# Patient Record
Sex: Male | Born: 1960 | Race: Black or African American | Hispanic: No | Marital: Married
Health system: Midwestern US, Community
[De-identification: ages and names within clinical notes are randomized; demographics above are authoritative.]

## PROBLEM LIST (undated history)

## (undated) DIAGNOSIS — I10 Essential (primary) hypertension: Secondary | ICD-10-CM

## (undated) DIAGNOSIS — Z1211 Encounter for screening for malignant neoplasm of colon: Secondary | ICD-10-CM

## (undated) DIAGNOSIS — C61 Malignant neoplasm of prostate: Secondary | ICD-10-CM

---

## 2014-05-01 NOTE — Patient Instructions (Signed)
DASH Diet: After Your Visit  Your Care Instructions  The DASH diet is an eating plan that can help lower your blood pressure. DASH stands for Dietary Approaches to Stop Hypertension. Hypertension is high blood pressure.  The DASH diet focuses on eating foods that are high in calcium, potassium, and magnesium. These nutrients can lower blood pressure. The foods that are highest in these nutrients are fruits, vegetables, low-fat dairy products, nuts, seeds, and legumes. But taking calcium, potassium, and magnesium supplements instead of eating foods that are high in those nutrients does not have the same effect. The DASH diet also includes whole grains, fish, and poultry.  The DASH diet is one of several lifestyle changes your doctor may recommend to lower your high blood pressure. Your doctor may also want you to decrease the amount of sodium in your diet. Lowering sodium while following the DASH diet can lower blood pressure even further than just the DASH diet alone.  Follow-up care is a key part of your treatment and safety. Be sure to make and go to all appointments, and call your doctor if you are having problems. It's also a good idea to know your test results and keep a list of the medicines you take.  How can you care for yourself at home?  Following the DASH diet  ?? Eat 4 to 5 servings of fruit each day. A serving is 1 medium-sized piece of fruit, ?? cup chopped or canned fruit, 1/4 cup dried fruit, or 4 ounces (?? cup) of fruit juice. Choose fruit more often than fruit juice.  ?? Eat 4 to 5 servings of vegetables each day. A serving is 1 cup of lettuce or raw leafy vegetables, ?? cup of chopped or cooked vegetables, or 4 ounces (?? cup) of vegetable juice. Choose vegetables more often than vegetable juice.  ?? Get 2 to 3 servings of low-fat and fat-free dairy each day. A serving is 8 ounces of milk, 1 cup of yogurt, or 1 ?? ounces of cheese.  ?? Eat 6 to 8 servings of grains each day. A serving is 1 slice of  bread, 1 ounce of dry cereal, or ?? cup of cooked rice, pasta, or cooked cereal. Try to choose whole-grain products as much as possible.  ?? Limit lean meat, poultry, and fish to 2 servings each day. A serving is 3 ounces, about the size of a deck of cards.  ?? Eat 4 to 5 servings of nuts, seeds, and legumes (cooked dried beans, lentils, and split peas) each week. A serving is 1/3 cup of nuts, 2 tablespoons of seeds, or ?? cup cooked dried beans or peas.  ?? Limit fats and oils to 2 to 3 servings each day. A serving is 1 teaspoon of vegetable oil or 2 tablespoons of salad dressing.  ?? Limit sweets and added sugars to 5 servings or less a week. A serving is 1 tablespoon jelly or jam, ?? cup sorbet, or 1 cup of lemonade.  ?? Eat less than 2,300 milligrams (mg) of sodium a day. If you have high blood pressure, diabetes, or chronic kidney disease, if you are African-American, or if you are older than age 50, try to limit the amount of sodium you eat to less than 1,500 mg a day.  Tips for success  ?? Start small. Do not try to make dramatic changes to your diet all at once. You might feel that you are missing out on your favorite foods and then be more   likely to not follow the plan. Make small changes, and stick with them. Once those changes become habit, add a few more changes.  ?? Try some of the following:  ?? Make it a goal to eat a fruit or vegetable at every meal and at snacks. This will make it easy to get the recommended amount of fruits and vegetables each day.  ?? Try yogurt topped with fruit and nuts for a snack or healthy dessert.  ?? Add lettuce, tomato, cucumber, and onion to sandwiches.  ?? Combine a ready-made pizza crust with low-fat mozzarella cheese and lots of vegetable toppings. Try using tomatoes, squash, spinach, broccoli, carrots, cauliflower, and onions.  ?? Have a variety of cut-up vegetables with a low-fat dip as an appetizer instead of chips and dip.  ?? Sprinkle sunflower seeds or chopped almonds over  salads. Or try adding chopped walnuts or almonds to cooked vegetables.  ?? Try some vegetarian meals using beans and peas. Add garbanzo or kidney beans to salads. Make burritos and tacos with mashed pinto beans or black beans.   Where can you learn more?   Go to http://www.healthwise.net/[facility]  Enter H967 in the search box to learn more about "DASH Diet: After Your Visit."   ?? 2006-2015 Healthwise, Incorporated. Care instructions adapted under license by [employer] (which disclaims liability or warranty for this information). This care instruction is for use with your licensed healthcare professional. If you have questions about a medical condition or this instruction, always ask your healthcare professional. Healthwise, Incorporated disclaims any warranty or liability for your use of this information.  Content Version: 10.4.390249; Current as of: February 21, 2013              Low Sodium Diet (2,000 Milligram): After Your Visit  Your Care Instructions  Too much sodium causes your body to hold on to extra water. This can raise your blood pressure and force your heart and kidneys to work harder. In very serious cases, this could cause you to be put in the hospital. It might even be life-threatening. By limiting sodium, you will feel better and lower your risk of serious problems.  The most common source of sodium is salt. People get most of the salt in their diet from canned, prepared, and packaged foods. Fast food and restaurant meals also are very high in sodium. Your doctor will probably limit your sodium to less than 2,000 milligrams (mg) a day. This limit counts all the sodium in prepared and packaged foods and any salt you add to your food.  And try to further reduce how much sodium you eat to less than 1,500 mg a day if you are 51 or older, are black, or have high blood pressure, diabetes, or chronic kidney disease.  Follow-up care is a key part of your treatment and safety. Be sure to make and go to all  appointments, and call your doctor if you are having problems. It's also a good idea to know your test results and keep a list of the medicines you take.  How can you care for yourself at home?  Read food labels  ?? Read labels on cans and food packages. The labels tell you how much sodium is in each serving. Make sure that you look at the serving size. If you eat more than the serving size, you have eaten more sodium.  ?? Food labels also tell you the Percent Daily Value for sodium. Choose products with low Percent Daily Values   for sodium.  ?? Be aware that sodium can come in forms other than salt, including monosodium glutamate (MSG), sodium citrate, and sodium bicarbonate (baking soda). MSG is often added to Asian food. When you eat out, you can sometimes ask for food without MSG or added salt.  Buy low-sodium foods  ?? Buy foods that are labeled "unsalted" (no salt added), "sodium-free" (less than 5 mg of sodium per serving), or "low-sodium" (less than 140 mg of sodium per serving). Foods labeled "reduced-sodium" and "light sodium" may still have too much sodium. Be sure to read the label to see how much sodium you are getting.  ?? Buy fresh vegetables, or frozen vegetables without added sauces. Buy low-sodium versions of canned vegetables, soups, and other canned goods.  Prepare low-sodium meals  ?? Cut back on the amount of salt you use in cooking. This will help you adjust to the taste. Do not add salt after cooking. One teaspoon of salt has about 2,300 mg of sodium.  ?? Take the salt shaker off the table.  ?? Flavor your food with garlic, lemon juice, onion, vinegar, herbs, and spices. Do not use soy sauce, lite soy sauce, steak sauce, onion salt, garlic salt, celery salt, mustard, or ketchup on your food.  ?? Use low-sodium salad dressings, sauces, and ketchup. Or make your own salad dressings and sauces without adding salt.  ?? Use less salt (or none) when recipes call for it. You can often use half the salt a  recipe calls for without losing flavor. Other foods such as rice, pasta, and grains do not need added salt.  ?? Rinse canned vegetables, and cook them in fresh water. This removes some???but not all???of the salt.  ?? Avoid water that is naturally high in sodium or that has been treated with water softeners, which add sodium. Call your local water company to find out the sodium content of your water supply. If you buy bottled water, read the label and choose a sodium-free brand.  Avoid high-sodium foods  ?? Avoid eating:  ?? Smoked, cured, salted, and canned meat, fish, and poultry.  ?? Ham, bacon, hot dogs, and luncheon meats.  ?? Regular, hard, and processed cheese and regular peanut butter.  ?? Crackers with salted tops, and other salted snack foods such as pretzels, chips, and salted popcorn.  ?? Frozen prepared meals, unless labeled low-sodium.  ?? Canned and dried soups, broths, and bouillon, unless labeled sodium-free or low-sodium.  ?? Canned vegetables, unless labeled sodium-free or low-sodium.  ?? [place] fries, pizza, tacos, and other fast foods.  ?? Pickles, olives, ketchup, and other condiments, especially soy sauce, unless labeled sodium-free or low-sodium.   Where can you learn more?   Go to [employer]  Enter V843 in the search box to learn more about "Low Sodium Diet (2,000 Milligram): After Your Visit."   ?? 2006-2015 Healthwise, Incorporated. Care instructions adapted under license by [employer] (which disclaims liability or warranty for this information). This care instruction is for use with your licensed healthcare professional. If you have questions about a medical condition or this instruction, always ask your healthcare professional. [place] any warranty or liability for your use of this information.  Content Version: 10.4.390249; Current as of: October 26, 2013              Heart-Healthy Diet: After Your Visit  Your Care Instructions     A heart-healthy  diet has lots of vegetables, fruits, nuts, beans, and whole grains,  and is low in salt. It limits foods that are high in saturated fat, such as meats, cheeses, and fried foods. It may be hard to change your diet, but even small changes can lower your risk of heart attack and heart disease.  Follow-up care is a key part of your treatment and safety. Be sure to make and go to all appointments, and call your doctor if you are having problems. It's also a good idea to know your test results and keep a list of the medicines you take.  How can you care for yourself at home?  Watch your portions  ?? Learn what a serving is. A "serving" and a "portion" are not always the same thing. Make sure that you are not eating larger portions than are recommended. For example, a serving of pasta is ?? cup. A serving size of meat is 2 to 3 ounces. A 3-ounce serving is about the size of a deck of cards. Measure serving sizes until you are good at "eyeballing" them. Keep in mind that restaurants often serve portions that are 2 or 3 times the size of one serving.  ?? To keep your energy level up and keep you from feeling hungry, eat often but in smaller portions.  ?? Eat only the number of calories you need to stay at a healthy weight. If you need to lose weight, eat fewer calories than your body burns (through exercise and other physical activity).  Eat more fruits and vegetables  ?? Eat a variety of fruit and vegetables every day. Dark green, deep orange, red, or yellow fruits and vegetables are especially good for you. Examples include spinach, carrots, peaches, and berries.  ?? Keep carrots, celery, and other veggies handy for snacks. Buy fruit that is in season and store it where you can see it so that you will be tempted to eat it.  ?? Cook dishes that have a lot of veggies in them, such as stir-fries and soups.  Limit saturated and trans fat  ?? Read food labels, and try to avoid saturated and trans fats. They increase your risk of heart  disease. Trans fat is found in many processed foods such as cookies and crackers.  ?? Use olive or canola oil when you cook. Try cholesterol-lowering spreads, such as Benecol or Take Control.  ?? Bake, broil, grill, or steam foods instead of frying them.  ?? Choose lean meats instead of high-fat meats such as hot dogs and sausages. Cut off all visible fat when you prepare meat.  ?? Eat fish, skinless poultry, and meat alternatives such as soy products instead of high-fat meats. Soy products, such as tofu, may be especially good for your heart.  ?? Choose low-fat or fat-free milk and dairy products.  Eat fish  ?? Eat at least two servings of fish a week. Certain fish, such as salmon and tuna, contain omega-3 fatty acids, which may help reduce your risk of heart attack.  Eat foods high in fiber  ?? Eat a variety of grain products every day. Include whole-grain foods that have lots of fiber and nutrients. Examples of whole-grain foods include oats, whole wheat bread, and brown rice.  ?? Buy whole-grain breads and cereals, instead of white bread or pastries.  Limit salt and sodium  ?? Limit how much salt and sodium you eat to help lower your blood pressure.  ?? Taste food before you salt it. Add only a little salt when you think you need it. With  time, your taste buds will adjust to less salt.  ?? Eat fewer snack items, fast foods, and other high-salt, processed foods. Check food labels for the amount of sodium in packaged foods.  ?? Choose low-sodium versions of canned goods (such as soups, vegetables, and beans).  Limit sugar  ?? Limit drinks and foods with added sugar. These include candy, desserts, and soda pop.  Limit alcohol  ?? Limit alcohol to no more than 2 drinks a day for men and 1 drink a day for women. Too much alcohol can cause health problems.  When should you call for help?  Watch closely for changes in your health, and be sure to contact your doctor if:  ?? You would like help planning heart-healthy meals.   Where can  you learn more?   Go to [employer]  Enter V137 in the search box to learn more about "Heart-Healthy Diet: After Your Visit."   ?? 2006-2015 Healthwise, Incorporated. Care instructions adapted under license by [employer] (which disclaims liability or warranty for this information). This care instruction is for use with your licensed healthcare professional. If you have questions about a medical condition or this instruction, always ask your healthcare professional. [place] any warranty or liability for your use of this information.  Content Version: 10.4.390249; Current as of: February 21, 2013

## 2014-05-01 NOTE — Progress Notes (Signed)
HISTORY OF PRESENT ILLNESS  Cameron Coleman is a 53 y.o. Here to establish care.   Presents today for a complete physical and preventative medicine exam.  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 Pertinent negatives include no chest pain, no abdominal pain, no headaches and no shortness of breath.     No current outpatient prescriptions on file.     No current facility-administered medications for this visit.     Family History   Problem Relation Age of Onset   ??? Cancer Mother    ??? Diabetes Father    ??? Cancer Father      History     Social History   ??? Marital Status: MARRIED     Spouse Name: N/A     Number of Children: N/A   ??? Years of Education: N/A     Occupational History   ??? Not on file.     Social History Main Topics   ??? Smoking status: Current Some Day Smoker   ??? Smokeless tobacco: Never Used   ??? Alcohol Use: Yes      Comment: Socially.    ??? Drug Use: No   ??? Sexual Activity:     Partners: Female     Museum/gallery curator: None     Other Topics Concern   ??? Military Service No   ??? Blood Transfusions No   ??? Caffeine Concern No   ??? 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    ??? Sleep Concern No   ??? Stress Concern No   ??? Weight Concern No   ??? Special Diet No   ??? Back Care No   ??? Exercise No   ??? Bike Helmet Yes   ??? Seat Belt Yes   ??? Self-Exams Yes     Social History Narrative   ??? No narrative on file       Review of Systems   Constitutional: Negative.  Negative for fever, chills, weight loss, malaise/fatigue and diaphoresis.   HENT: Negative.  Negative for ear discharge, ear pain, hearing loss, nosebleeds, sore throat and tinnitus.    Eyes: Negative.  Negative for blurred vision, double vision, photophobia, pain, discharge and redness.    Respiratory: Negative.  Negative for cough, hemoptysis, sputum production, shortness of breath and wheezing.    Cardiovascular: Negative for chest pain, palpitations, orthopnea, claudication and leg swelling.   Gastrointestinal: Negative.  Negative for heartburn, nausea, vomiting, abdominal pain, diarrhea, constipation, blood in stool and melena.   Genitourinary: Negative.  Negative for dysuria, urgency, frequency, hematuria and flank pain.   Musculoskeletal: Negative.  Negative for myalgias and joint pain.   Skin: Negative.  Negative for rash.   Neurological: Negative.  Negative for dizziness, tremors, sensory change, speech change, focal weakness, seizures, loss of consciousness, weakness and headaches.   Endo/Heme/Allergies: Negative.  Negative for environmental allergies and polydipsia. Does not bruise/bleed easily.   Psychiatric/Behavioral: Negative.  Negative for depression, suicidal ideas, hallucinations, memory loss and substance abuse. The patient is not nervous/anxious and does not have insomnia.      BP  152/90   Pulse 88   Temp(Src) 98.1 ??F (36.7 ??C) (Oral)   Resp 20   Ht 6' 0.5" (1.842 m)   Wt 280 lb 9.6 oz (127.279 kg)   BMI 37.51 kg/m2   SpO2 97%    Physical Exam   Constitutional: He is oriented to person, place, and time. He appears well-developed and well-nourished.   HENT:   Head: Normocephalic and atraumatic.   Right Ear: External ear normal.   Nose: Nose normal.   Mouth/Throat: Oropharynx is clear and moist.   Eyes: Conjunctivae and EOM are normal. Pupils are equal, round, and reactive to light.   Neck: Normal range of motion. Neck supple. No JVD present. No tracheal deviation present. No thyromegaly present.   Cardiovascular: Normal rate, regular rhythm, normal heart sounds and intact distal pulses.  Exam reveals no gallop and no friction rub.    No murmur heard.  Pulmonary/Chest: Effort normal and breath sounds normal. No respiratory distress. He has no wheezes. He has no rales. He exhibits no  tenderness.   Abdominal: Soft. Bowel sounds are normal. He exhibits mass. He exhibits no distension. There is no tenderness.   Musculoskeletal: He exhibits edema.   trace ankle edema present left greater than right   Lymphadenopathy:     He has no cervical adenopathy.   Neurological: He is alert and oriented to person, place, and time. No cranial nerve deficit. Coordination normal.   Psychiatric: He has a normal mood and affect. His behavior is normal. Judgment and thought content normal.   Nursing note and vitals reviewed.      ASSESSMENT and PLAN    ICD-9-CM    1. Elevated blood pressure (not hypertension) 796.2 AMB POC EKG ROUTINE W/ 12 LEADS, INTER & REP     METABOLIC PANEL, COMPREHENSIVE     URINALYSIS W/MICROSCOPIC   2. Ankle swelling, unspecified laterality 413.24 METABOLIC PANEL, COMPREHENSIVE   3. Screening for diabetes mellitus V77.1 HGB A1C WITH EAG ESTIMATION   4. Screening for cholesterol level V77.91 LIPID PANEL   5. Screening for prostate cancer V76.44 PROSTATE SPECIFIC AG (PSA)   6. Screening for colon cancer V76.51 OCCULT BLOOD, STOOL     CBC WITH AUTOMATED DIFF   7. Routine general medical examination at a health care facility V70.0 CBC WITH AUTOMATED DIFF     Follow-up Disposition:  Return in about 2 weeks (around 05/15/2014).

## 2014-05-01 NOTE — Progress Notes (Signed)
Cameron Coleman is a 53 y.o. male presents in office to establish care and for bilateral ankle swelling.           1. Have you been to the ER, urgent care clinic since your last visit?  Hospitalized since your last visit?No    2. Have you seen or consulted any other health care providers outside of the [place] [facility] since your last visit?  Include any pap smears or colon screening. No

## 2014-05-01 NOTE — Progress Notes (Signed)
HISTORY OF PRESENT ILLNESS  Cameron Coleman is a 53 y.o. male... Patient states that he noticed swelling of his hands 4 days ago after flying from [place]. His daughter and his wife noted that he had bilateral ankle swelling 3 days ago. Patient does not have a history of hypertension but his blood pressure is noted to be elevated today. Patient denies any shortness of breath or any leg pain. Patient admits to eat he a lot of [place] while in [place] and doing a lot of walking..    Ankle swelling   The history is provided by the patient, relative and spouse. This is a new problem. The current episode started more than 2 days ago. The problem occurs constantly. The problem has been gradually improving. The pain is present in the right foot, left foot, right ankle and left ankle. The patient is experiencing no pain. Pertinent negatives include no numbness, full range of motion, no stiffness and no tingling. He has tried nothing for the symptoms. There has been no history of extremity trauma.   Hypertension   The history is provided by the medical records. This is a new problem. The problem has not changed since onset.Pertinent negatives include no chest pain, no blurred vision, no headaches, no peripheral edema, no dizziness and no shortness of breath. There are no associated agents to hypertension. Risk factors include family history and male gender.     No current outpatient prescriptions on file.     No current facility-administered medications for this visit.     Family History   Problem Relation Age of Onset   ??? Cancer Mother    ??? Diabetes Father    ??? Cancer Father      History     Social History   ??? Marital Status: MARRIED     Spouse Name: N/A     Number of Children: N/A   ??? Years of Education: N/A     Occupational History   ??? Not on file.     Social History Main Topics   ??? Smoking status: Current Some Day Smoker   ??? Smokeless tobacco: Never Used   ??? Alcohol Use: Yes      Comment: Socially.    ??? Drug Use: No    ??? Sexual Activity:     Partners: Female     Museum/gallery curator: None     Other Topics Concern   ??? Military Service No   ??? Blood Transfusions No   ??? Caffeine Concern No   ??? 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    ??? Sleep Concern No   ??? Stress Concern No   ??? Weight Concern No   ??? Special Diet No   ??? Back Care No   ??? Exercise No   ??? Bike Helmet Yes   ??? Seat Belt Yes   ??? Self-Exams Yes     Social History Narrative   ??? No narrative on file         Review of Systems   Constitutional: Negative.    Eyes: Negative.  Negative for blurred vision.   Respiratory: Negative.  Negative for shortness of breath.    Cardiovascular: Negative.  Negative for chest pain.   Musculoskeletal: Negative for stiffness.   Neurological: Negative.  Negative for dizziness, tingling, numbness and headaches.   Psychiatric/Behavioral: Negative.      BP 152/90   Pulse 88   Temp(Src)  98.1 ??F (36.7 ??C) (Oral)   Resp 20   Ht 6' 0.5" (1.842 m)   Wt 280 lb 9.6 oz (127.279 kg)   BMI 37.51 kg/m2   SpO2 97%    Physical Exam   Constitutional: He is oriented to person, place, and time. He appears well-developed and well-nourished.   HENT:   Head: Normocephalic.   Cardiovascular: Normal rate, regular rhythm, normal heart sounds and intact distal pulses.    Pulmonary/Chest: Effort normal and breath sounds normal.   Musculoskeletal: He exhibits no edema.   Neurological: He is alert and oriented to person, place, and time. No cranial nerve deficit.   Psychiatric: He has a normal mood and affect. His behavior is normal. Judgment and thought content normal.   Nursing note and vitals reviewed.      ASSESSMENT and PLAN    ICD-9-CM    1. Elevated blood pressure (not hypertension) 796.2 AMB POC EKG ROUTINE W/ 12 LEADS, INTER & REP     METABOLIC PANEL, COMPREHENSIVE   2. Ankle swelling, unspecified laterality 595.63 METABOLIC PANEL, COMPREHENSIVE     Follow-up Disposition: Not on File Patient has been asked to check his blood  pressure daily and chart. He has been given a low-sodium diet and the DASH diet. Patient is to followup with me in 2 weeks.

## 2014-05-02 LAB — LIPID PANEL
Cholesterol, total: 199 mg/dL (ref 100–199)
HDL Cholesterol: 39 mg/dL — ABNORMAL LOW (ref >39)
LDL, calculated: 130 mg/dL — ABNORMAL HIGH (ref 0–99)
Triglyceride: 152 mg/dL — ABNORMAL HIGH (ref 0–149)
VLDL, calculated: 30 mg/dL (ref 5–40)

## 2014-05-02 LAB — CBC WITH AUTOMATED DIFF
ABS. BASOPHILS: 0 10*3/uL (ref 0.0–0.2)
ABS. EOSINOPHILS: 0.1 10*3/uL (ref 0.0–0.4)
ABS. IMM. GRANS.: 0 10*3/uL (ref 0.0–0.1)
ABS. MONOCYTES: 0.5 10*3/uL (ref 0.1–0.9)
ABS. NEUTROPHILS: 2.6 10*3/uL (ref 1.4–7.0)
Abs Lymphocytes: 2.4 10*3/uL (ref 0.7–3.1)
BASOPHILS: 0 %
EOSINOPHILS: 1 %
HCT: 44.6 % (ref 37.5–51.0)
HGB: 15.5 g/dL (ref 12.6–17.7)
IMMATURE GRANULOCYTES: 0 %
Lymphocytes: 44 %
MCH: 30.1 pg (ref 26.6–33.0)
MCHC: 34.8 g/dL (ref 31.5–35.7)
MCV: 87 fL (ref 79–97)
MONOCYTES: 9 %
NEUTROPHILS: 46 %
PLATELET: 232 10*3/uL (ref 150–379)
RBC: 5.15 x10E6/uL (ref 4.14–5.80)
RDW: 14.9 % (ref 12.3–15.4)
WBC: 5.6 10*3/uL (ref 3.4–10.8)

## 2014-05-02 LAB — METABOLIC PANEL, COMPREHENSIVE
A-G Ratio: 1.4 (ref 1.1–2.5)
ALT (SGPT): 41 IU/L (ref 0–44)
AST (SGOT): 38 IU/L (ref 0–40)
Albumin: 4.6 g/dL (ref 3.5–5.5)
Alk. phosphatase: 60 IU/L (ref 39–117)
BUN/Creatinine ratio: 10 (ref 9–20)
BUN: 13 mg/dL (ref 6–24)
Bilirubin, total: 0.8 mg/dL (ref 0.0–1.2)
CO2: 26 mmol/L (ref 18–29)
Calcium: 10 mg/dL (ref 8.7–10.2)
Chloride: 97 mmol/L (ref 97–108)
Creatinine: 1.27 mg/dL (ref 0.76–1.27)
GFR est AA: 76 mL/min/{1.73_m2} (ref >59)
GFR est non-AA: 65 mL/min/{1.73_m2} (ref >59)
GLOBULIN, TOTAL: 3.4 g/dL (ref 1.5–4.5)
Glucose: 120 mg/dL — ABNORMAL HIGH (ref 65–99)
Potassium: 3.9 mmol/L (ref 3.5–5.2)
Protein, total: 8 g/dL (ref 6.0–8.5)
Sodium: 140 mmol/L (ref 134–144)

## 2014-05-02 LAB — URINALYSIS W/MICROSCOPIC
Bilirubin: NEGATIVE
Blood: NEGATIVE
Glucose: NEGATIVE
Leukocyte Esterase: NEGATIVE
Nitrites: NEGATIVE
Specific Gravity: 1.03 (ref 1.005–1.030)
Urobilinogen: 0.2 mg/dL (ref 0.0–1.9)
pH (UA): 5.5 (ref 5.0–7.5)

## 2014-05-02 LAB — MICROSCOPIC EXAMINATION: Epithelial cells: NONE SEEN /hpf (ref 0–10)

## 2014-05-02 LAB — CVD REPORT

## 2014-05-02 LAB — HGB A1C WITH EAG ESTIMATION
Estim. Avg Glu (eAG): 128 mg/dL
Hemoglobin A1c: 6.1 % — ABNORMAL HIGH (ref 4.8–5.6)

## 2014-05-02 LAB — PSA, DIAGNOSTIC (PROSTATE SPECIFIC AG): Prostate Specific Ag: 2.3 ng/mL (ref 0.0–4.0)

## 2014-05-25 NOTE — Progress Notes (Signed)
HISTORY OF PRESENT ILLNESS  Cameron Coleman is a 53 y.o. male.Here for followup of elevated blood pressure. Patient describes a strange sensation in his left chest. It is not pressure is not pain it is not associated with shortness of breath or dizziness. It does not radiate. Sensation lasts for 1-2 seconds. He describes it as a flutter.Patient's blood pressure average at home is 126/70.  Palpitations   The history is provided by the patient. This is a new problem. The current episode started more than 2 days ago. The problem has not changed since onset.The problem occurs daily. The problem is associated with an unknown factor. Pertinent negatives include no diaphoresis, no malaise/fatigue, no numbness, no chest pain, no chest pressure, no near-syncope, no syncope, no headaches, no dizziness, no weakness and no shortness of breath. His past medical history is significant for hypertension.   Hypertension   The history is provided by the patient and medical records. This is a new problem. The problem has been resolved. Associated symptoms include palpitations. Pertinent negatives include no chest pain, no malaise/fatigue, no headaches, no peripheral edema, no dizziness and no shortness of breath. There are no associated agents to hypertension. Risk factors include male gender.   Ankle swelling   The history is provided by the patient. This is a new problem. The problem has been resolved. The pain is present in the right ankle and left ankle. The patient is experiencing no pain. Pertinent negatives include no numbness, full range of motion, no stiffness and no itching. The symptoms are aggravated by standing. He has tried nothing for the symptoms. There has been no history of extremity trauma.   No Known Allergies    No current outpatient prescriptions on file.     No current facility-administered medications for this visit.     .  Family History   Problem Relation Age of Onset   ??? Cancer Mother    ??? Diabetes Father     ??? Cancer Father      History     Social History   ??? Marital Status: MARRIED     Spouse Name: N/A     Number of Children: N/A   ??? Years of Education: N/A     Occupational History   ??? Not on file.     Social History Main Topics   ??? Smoking status: Current Some Day Smoker   ??? Smokeless tobacco: Never Used   ??? Alcohol Use: Yes      Comment: Socially.    ??? Drug Use: No   ??? Sexual Activity:     Partners: Female     Museum/gallery curator: None     Other Topics Concern   ??? Military Service No   ??? Blood Transfusions No   ??? Caffeine Concern No   ??? 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    ??? Sleep Concern No   ??? Stress Concern No   ??? Weight Concern No   ??? Special Diet No   ??? Back Care No   ??? Exercise No   ??? Bike Helmet Yes   ??? Seat Belt Yes   ??? Self-Exams Yes     Social History Narrative         Review of Systems   Constitutional: Negative.  Negative for malaise/fatigue and diaphoresis.   Respiratory: Negative for shortness of breath.    Cardiovascular: Positive for palpitations. Negative for chest pain,  syncope and near-syncope.   Musculoskeletal: Negative.  Negative for stiffness.   Skin: Negative for itching.   Neurological: Negative.  Negative for dizziness, weakness, numbness and headaches.   Psychiatric/Behavioral: Negative.    BP 130/80   Pulse 63   Temp(Src) 97.7 ??F (36.5 ??C) (Oral)   Resp 16   Ht 6' 0.52" (1.842 m)   Wt 278 lb (126.1 kg)   BMI 37.17 kg/m2   SpO2 97%      Physical Exam   Constitutional: He is oriented to person, place, and time. He appears well-developed and well-nourished.   HENT:   Head: Normocephalic and atraumatic.   Cardiovascular: Normal rate, regular rhythm, normal heart sounds and intact distal pulses.    Pulmonary/Chest: Effort normal and breath sounds normal.   Musculoskeletal: He exhibits no edema.   Neurological: He is alert and oriented to person, place, and time. No cranial nerve deficit.   Psychiatric: He has a normal mood and affect. His behavior  is normal. Judgment and thought content normal.   Nursing note and vitals reviewed.      ASSESSMENT and PLAN    ICD-9-CM    1. Elevated blood pressure (not hypertension) 796.2    2. Palpitations 785.1 EVENT MONITOR POST SYMPTOMS     Follow-up Disposition:  Return in about 4 weeks (around 06/22/2014).

## 2014-05-25 NOTE — Progress Notes (Signed)
Cameron Coleman is a 53 y.o. male presents in office to follow-up with elevated BP.           1. Have you been to the ER, urgent care clinic since your last visit?  Hospitalized since your last visit?No    2. Have you seen or consulted any other health care providers outside of the [facility] since your last visit?  Include any pap smears or colon screening. No

## 2014-10-15 ENCOUNTER — Ambulatory Visit
Admit: 2014-10-15 | Discharge: 2014-10-15 | Payer: PRIVATE HEALTH INSURANCE | Attending: Internal Medicine | Primary: Internal Medicine

## 2014-10-15 DIAGNOSIS — R030 Elevated blood-pressure reading, without diagnosis of hypertension: Secondary | ICD-10-CM

## 2014-10-15 NOTE — Progress Notes (Signed)
HISTORY OF PRESENT ILLNESS  Cameron Coleman is a 53 y.o. male.Here for followup of hypertension. Patient states that blood pressure had dropped to 118 over the 70s.   Hypertension   The history is provided by the patient and medical records. This is a chronic problem. The problem has been gradually improving. Pertinent negatives include no chest pain, no peripheral edema, no dizziness and no shortness of breath. There are no associated agents to hypertension. Risk factors include male gender.   Blood sugar problem  The history is provided by the patient. This is a new problem. The problem has not changed since onset.Pertinent negatives include no chest pain and no shortness of breath. The symptoms are aggravated by eating. Nothing relieves the symptoms. He has tried nothing for the symptoms.   Cholesterol Problem  The history is provided by the patient and medical records. This is a new problem. The problem has not changed since onset.Pertinent negatives include no chest pain and no shortness of breath. The symptoms are aggravated by eating. Nothing relieves the symptoms. He has tried nothing for the symptoms.   No Known Allergies  No current outpatient prescriptions on file prior to visit.     No current facility-administered medications on file prior to visit.     Family History   Problem Relation Age of Onset   ??? Cancer Mother    ??? Diabetes Father    ??? Cancer Father      History     Social History   ??? Marital Status: MARRIED     Spouse Name: N/A     Number of Children: N/A   ??? Years of Education: N/A     Occupational History   ??? Not on file.     Social History Main Topics   ??? Smoking status: Current Some Day Smoker   ??? Smokeless tobacco: Never Used   ??? Alcohol Use: Yes      Comment: Socially.    ??? Drug Use: No   ??? Sexual Activity:     Partners: Female     Museum/gallery curator: None     Other Topics Concern   ??? Military Service No   ??? Blood Transfusions No   ??? Caffeine Concern No    ??? 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    ??? Sleep Concern No   ??? Stress Concern No   ??? Weight Concern No   ??? Special Diet No   ??? Back Care No   ??? Exercise No   ??? Bike Helmet Yes   ??? Seat Belt Yes   ??? Self-Exams Yes     Social History Narrative         Review of Systems   Constitutional: Negative.    Respiratory: Negative.  Negative for shortness of breath.    Cardiovascular: Negative.  Negative for chest pain.   Musculoskeletal: Negative.    Neurological: Negative.  Negative for dizziness.   Endo/Heme/Allergies: Negative.    Psychiatric/Behavioral: Negative.      BP 118/76 mmHg   Pulse 94   Temp(Src) 96.4 ??F (35.8 ??C) (Oral)   Resp 16   Ht 6' 0.5" (1.842 m)   Wt 284 lb 3.2 oz (128.912 kg)   BMI 37.99 kg/m2   SpO2 99%    Physical Exam   Constitutional: He is oriented to person, place, and time. He appears well-developed and well-nourished.   HENT:   Head:  Normocephalic and atraumatic.   Cardiovascular: Normal rate, regular rhythm, normal heart sounds and intact distal pulses.  Exam reveals no gallop and no friction rub.    No murmur heard.  Pulmonary/Chest: Effort normal and breath sounds normal. No respiratory distress. He has no wheezes. He has no rales.   Musculoskeletal: He exhibits no edema.   Neurological: He is alert and oriented to person, place, and time. No cranial nerve deficit. Coordination normal.   Psychiatric: He has a normal mood and affect. His behavior is normal. Judgment and thought content normal.   Nursing note and vitals reviewed.      ASSESSMENT and PLAN    ICD-10-CM ICD-9-CM    1. Elevated blood pressure (not hypertension) R03.0 796.2 REFERRAL TO NUTRITION      METABOLIC PANEL, COMPREHENSIVE   2. Dyslipidemia E78.5 272.4 REFERRAL TO NUTRITION      LIPID PANEL   3. Elevated blood sugar R73.09 790.29 REFERRAL TO NUTRITION      METABOLIC PANEL, COMPREHENSIVE      HEMOGLOBIN A1C   4. Encounter for colorectal cancer screening Z12.9 V76.51 REFERRAL FOR  COLONOSCOPY     Follow-up Disposition:  Return in about 4 weeks (around 11/12/2014).

## 2014-10-15 NOTE — Progress Notes (Signed)
Cameron Coleman is a 53 y.o. male presents to office for follow up HTN.     1. Have you been to the ER, urgent care clinic or hospitalized since your last visit? no   2. Have you seen or consulted any other health care providers outside of the [facility] since your last visit? no

## 2014-10-22 NOTE — Telephone Encounter (Signed)
Unsuccessful attempt to contact Mr. Stthomas to schedule a colon screening with Dr. Fortino Sic per referral received from Dr. Haze Rushing. Mr. Logan voicemail recording states "voicemail is not set up yet." Mailed letter requesting Mr. Dolinger to contact our office to schedule an appointment.

## 2014-11-12 ENCOUNTER — Encounter: Attending: Internal Medicine | Primary: Internal Medicine

## 2014-11-20 ENCOUNTER — Encounter: Attending: Internal Medicine | Primary: Internal Medicine

## 2014-11-20 ENCOUNTER — Ambulatory Visit
Admit: 2014-11-20 | Discharge: 2014-11-20 | Payer: PRIVATE HEALTH INSURANCE | Attending: Internal Medicine | Primary: Internal Medicine

## 2014-11-20 DIAGNOSIS — R030 Elevated blood-pressure reading, without diagnosis of hypertension: Secondary | ICD-10-CM

## 2014-11-20 NOTE — Progress Notes (Signed)
HISTORY OF PRESENT ILLNESS  Cameron Coleman is a 53 y.o. male.Here for followup of elevated blood pressure elevated blood sugar and dyslipidemia. Patient states that he has been exercising and has attempted to improve his diet.   Cholesterol Problem  The history is provided by the patient. This is a chronic problem. The problem has been gradually improving. Pertinent negatives include no chest pain, no abdominal pain, no headaches and no shortness of breath. The symptoms are aggravated by eating. Nothing relieves the symptoms. Treatments tried: lifestyle modification.   High Blood Sugar  The history is provided by the patient and medical records. This is a chronic problem. The problem has been gradually improving. Pertinent negatives include no chest pain, no abdominal pain, no headaches and no shortness of breath. The symptoms are aggravated by eating. Nothing relieves the symptoms. Treatments tried: lifestyle modification. The treatment provided mild relief.   Hypertension   The history is provided by the patient and medical records. The problem has been gradually improving. Pertinent negatives include no chest pain, no headaches, no peripheral edema, no dizziness and no shortness of breath. There are no associated agents to hypertension. Risk factors include male gender.     No Known Allergies  No current outpatient prescriptions on file prior to visit.     No current facility-administered medications on file prior to visit.     Past Medical History   Diagnosis Date   ??? Arthritis      arthritis and left knee status post drainage x2     History reviewed. No pertinent past surgical history.  Family History   Problem Relation Age of Onset   ??? Cancer Mother    ??? Diabetes Father    ??? Cancer Father      History     Social History   ??? Marital Status: MARRIED     Spouse Name: N/A     Number of Children: N/A   ??? Years of Education: N/A     Occupational History   ??? Not on file.     Social History Main Topics    ??? Smoking status: Current Some Day Smoker   ??? Smokeless tobacco: Never Used   ??? Alcohol Use: Yes      Comment: Socially.    ??? Drug Use: No   ??? Sexual Activity:     Partners: Female     Museum/gallery curator: None     Other Topics Concern   ??? Military Service No   ??? Blood Transfusions No   ??? Caffeine Concern No   ??? 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    ??? Sleep Concern No   ??? Stress Concern No   ??? Weight Concern No   ??? Special Diet No   ??? Back Care No   ??? Exercise No   ??? Bike Helmet Yes   ??? Seat Belt Yes   ??? Self-Exams Yes     Social History Narrative       Review of Systems   Constitutional: Negative.    Respiratory: Negative.  Negative for shortness of breath.    Cardiovascular: Negative.  Negative for chest pain.   Gastrointestinal: Negative for abdominal pain.   Neurological: Negative.  Negative for dizziness and headaches.   Endo/Heme/Allergies: Negative.    Psychiatric/Behavioral: Negative.    BP 108/52 mmHg   Pulse 82   Temp(Src) 96.8 ??F (36 ??C) (Oral)   Resp  12   Ht 6' (1.829 m)   Wt 280 lb 12.8 oz (127.37 kg)   BMI 38.07 kg/m2   SpO2 97%      Physical Exam   Constitutional: He is oriented to person, place, and time. He appears well-developed and well-nourished.   HENT:   Head: Normocephalic and atraumatic.   Cardiovascular: Normal rate, regular rhythm, normal heart sounds and intact distal pulses.  Exam reveals no gallop and no friction rub.    No murmur heard.  Pulmonary/Chest: Effort normal and breath sounds normal. No respiratory distress. He has no wheezes. He has no rales.   Musculoskeletal: He exhibits no edema.   Neurological: He is alert and oriented to person, place, and time. No cranial nerve deficit.   Psychiatric: He has a normal mood and affect. His behavior is normal. Judgment and thought content normal.   Nursing note and vitals reviewed.      ASSESSMENT and PLAN    ICD-10-CM ICD-9-CM    1. Elevated blood pressure (not hypertension) R03.0 796.2     2. Elevated blood sugar R73.09 790.29    3. Dyslipidemia E78.5 272.4    4. Screening for colon cancer Z12.11 V76.51 REFERRAL FOR COLONOSCOPY     Follow-up Disposition:  Return in about 6 months (around 05/22/2015).  current treatment plan is effective, no change in therapy

## 2014-11-20 NOTE — Progress Notes (Signed)
Cameron Coleman is a 53 y.o. male presents to office for follow up elevated blood pressure, hyperlipidemia and hyperglycemia.       1. Have you been to the ER, urgent care clinic or hospitalized since your last visit? No   2. Have you seen any other providers outside of [facility] since your last visit? No

## 2014-11-21 LAB — LIPID PANEL
Cholesterol, total: 193 mg/dL (ref 100–199)
HDL Cholesterol: 35 mg/dL — ABNORMAL LOW (ref >39)
LDL, calculated: 131 mg/dL — ABNORMAL HIGH (ref 0–99)
Triglyceride: 134 mg/dL (ref 0–149)
VLDL, calculated: 27 mg/dL (ref 5–40)

## 2014-11-21 LAB — METABOLIC PANEL, COMPREHENSIVE
A-G Ratio: 1.5 (ref 1.1–2.5)
ALT (SGPT): 23 IU/L (ref 0–44)
AST (SGOT): 23 IU/L (ref 0–40)
Albumin: 4.4 g/dL (ref 3.5–5.5)
Alk. phosphatase: 59 IU/L (ref 39–117)
BUN/Creatinine ratio: 10 (ref 9–20)
BUN: 13 mg/dL (ref 6–24)
Bilirubin, total: 0.7 mg/dL (ref 0.0–1.2)
CO2: 23 mmol/L (ref 18–29)
Calcium: 9.3 mg/dL (ref 8.7–10.2)
Chloride: 102 mmol/L (ref 97–108)
Creatinine: 1.24 mg/dL (ref 0.76–1.27)
GFR est AA: 77 mL/min/{1.73_m2} (ref >59)
GFR est non-AA: 67 mL/min/{1.73_m2} (ref >59)
GLOBULIN, TOTAL: 3 g/dL (ref 1.5–4.5)
Glucose: 87 mg/dL (ref 65–99)
Potassium: 4.4 mmol/L (ref 3.5–5.2)
Protein, total: 7.4 g/dL (ref 6.0–8.5)
Sodium: 141 mmol/L (ref 134–144)

## 2014-11-21 LAB — HEMOGLOBIN A1C WITH EAG: Hemoglobin A1c: 6 % — ABNORMAL HIGH (ref 4.8–5.6)

## 2014-11-21 LAB — CVD REPORT

## 2014-12-03 ENCOUNTER — Ambulatory Visit: Admit: 2014-12-03 | Payer: PRIVATE HEALTH INSURANCE | Attending: Internal Medicine | Primary: Internal Medicine

## 2014-12-03 ENCOUNTER — Encounter: Attending: Internal Medicine | Primary: Internal Medicine

## 2014-12-03 ENCOUNTER — Encounter: Admit: 2014-12-03 | Primary: Internal Medicine

## 2014-12-03 DIAGNOSIS — R042 Hemoptysis: Secondary | ICD-10-CM

## 2014-12-03 NOTE — Progress Notes (Signed)
Cameron Coleman is a 53 y.o. male presents in office for complaints of cough which began yesterday. Patient reports yellowish blood tinged sputum noted.           1. Have you been to the ER, urgent care clinic since your last visit?  Hospitalized since your last visit?No    2. Have you seen or consulted any other health care providers outside of the [facility] since your last visit?  Include any pap smears or colon screening. No

## 2014-12-03 NOTE — Progress Notes (Signed)
HISTORY OF PRESENT ILLNESS  Cameron Coleman is a 53 y.o. male Here for evaluation of cough. Patient states that the past 2 days he has had a cough and today noticed blood-tinged sputum. He states that he has no chest congestion but has noticed sinus discomfort intermittently for 6-8 months. 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   Cough  The history is provided by the patient. This is a new problem. The problem has not changed since onset.Associated symptoms include shortness of breath. Pertinent negatives include no chest pain. Nothing aggravates the symptoms. Nothing relieves the symptoms. He has tried nothing for the symptoms.    No Known Allergies  No current outpatient prescriptions on file prior to visit.     No current facility-administered medications on file prior to visit.     Mr. Norman does not currently have medications on file.  History     Social History   ??? Marital Status: MARRIED     Spouse Name: N/A     Number of Children: N/A   ??? Years of Education: N/A     Occupational History   ??? Not on file.     Social History Main Topics   ??? Smoking status: Current Some Day Smoker   ??? Smokeless tobacco: Never Used   ??? Alcohol Use: Yes      Comment: Socially.    ??? Drug Use: No   ??? Sexual Activity:     Partners: Female     Museum/gallery curator: None     Other Topics Concern   ??? Military Service No   ??? Blood Transfusions No   ??? Caffeine Concern No   ??? 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    ??? Sleep Concern No   ??? Stress Concern No   ??? Weight Concern No   ??? Special Diet No   ??? Back Care No   ??? Exercise No   ??? Bike Helmet Yes   ??? Seat Belt Yes   ??? Self-Exams Yes     Social History Narrative         Review of Systems   Constitutional: Negative.    Respiratory: Positive for cough, hemoptysis and shortness of breath.    Cardiovascular: Negative.  Negative for chest pain.   Neurological: Negative.    Psychiatric/Behavioral: Negative.       BP 118/78 mmHg   Pulse 64   Temp(Src) 97.8 ??F (36.6 ??C) (Oral)   Resp 16   Ht 6' 0.01" (1.829 m)   Wt 279 lb (126.554 kg)   BMI 37.83 kg/m2   SpO2 98%     Physical Exam   Constitutional: He is oriented to person, place, and time. He appears well-developed and well-nourished.   HENT:   Head: Normocephalic and atraumatic.   Cardiovascular: Normal rate, regular rhythm, normal heart sounds and intact distal pulses.  Exam reveals no gallop and no friction rub.    No murmur heard.  Pulmonary/Chest: Effort normal and breath sounds normal. No respiratory distress. He has no wheezes. He has no rales.   Musculoskeletal: He exhibits no edema.   Neurological: He is alert and oriented to person, place, and time. No cranial nerve deficit.   Psychiatric: He has a normal mood and affect. His behavior is normal. Judgment and thought content normal.   Nursing note and vitals reviewed.      ASSESSMENT and PLAN    ICD-10-CM  ICD-9-CM    1. Hemoptysis R04.2 786.30 XR CHEST PA LAT      REFERRAL TO PULMONARY DISEASE   2. Sinusitis, unspecified chronicity, unspecified location J32.9       Follow-up Disposition: Not on File  the following changes in treatment are made: She will be referred to pulmonary because of occupational lung exposure history with hemoptysis. Patient has been asked to use Afrin nasal spray one spray each nostril every 12 hours for sinusitis symptoms.

## 2014-12-04 MED ORDER — AZITHROMYCIN 250 MG TAB
250 mg | ORAL | Status: AC
Start: 2014-12-04 — End: 2014-12-09

## 2014-12-04 NOTE — Telephone Encounter (Signed)
Dr. Ellison Hughs, please refill the following, thank you.    Requested Prescriptions     Pending Prescriptions Disp Refills   ??? azithromycin (ZITHROMAX) 250 mg tablet 6 Each 0     Sig: Take 2 tablets today, then take 1 tablet daily

## 2015-04-17 ENCOUNTER — Ambulatory Visit
Admit: 2015-04-17 | Discharge: 2015-04-17 | Payer: PRIVATE HEALTH INSURANCE | Attending: Family Medicine | Primary: Internal Medicine

## 2015-04-17 ENCOUNTER — Encounter: Attending: Family Medicine | Primary: Internal Medicine

## 2015-04-17 DIAGNOSIS — R070 Pain in throat: Secondary | ICD-10-CM

## 2015-04-17 LAB — AMB POC RAPID STREP A: Group A Strep Ag: NEGATIVE

## 2015-04-17 NOTE — Progress Notes (Signed)
Cameron Coleman is a 55 y.o. male here today to discuss pain in neck. Patient stated that on the left side of his neck has been hurting fir the last 5-6 days.

## 2015-04-17 NOTE — Patient Instructions (Signed)
Sore Throat: Care Instructions  Your Care Instructions     Infection by bacteria or a virus causes most sore throats. Cigarette smoke, dry air, air pollution, allergies, and yelling can also cause a sore throat. Sore throats can be painful and annoying. Fortunately, most sore throats go away on their own. If you have a bacterial infection, your doctor may prescribe antibiotics.  Follow-up care is a key part of your treatment and safety. Be sure to make and go to all appointments, and call your doctor if you are having problems. It's also a good idea to know your test results and keep a list of the medicines you take.  How can you care for yourself at home?  ?? If your doctor prescribed antibiotics, take them as directed. Do not stop taking them just because you feel better. You need to take the full course of antibiotics.  ?? Gargle with warm salt water once an hour to help reduce swelling and relieve discomfort. Use 1 teaspoon of salt mixed in 1 cup of warm water.  ?? Take an over-the-counter pain medicine, such as acetaminophen (Tylenol), ibuprofen (Advil, Motrin), or naproxen (Aleve). Read and follow all instructions on the label.  ?? Be careful when taking over-the-counter cold or flu medicines and Tylenol at the same time. Many of these medicines have acetaminophen, which is Tylenol. Read the labels to make sure that you are not taking more than the recommended dose. Too much acetaminophen (Tylenol) can be harmful.  ?? Drink plenty of fluids. Fluids may help soothe an irritated throat. Hot fluids, such as tea or soup, may help decrease throat pain.  ?? Use over-the-counter throat lozenges to soothe pain. Regular cough drops or hard candy may also help. These should not be given to young children because of the risk of choking.  ?? Do not smoke or allow others to smoke around you. If you need help quitting, talk to your doctor about stop-smoking programs and medicines.  These can increase your chances of quitting for good.  ?? Use a vaporizer or humidifier to add moisture to your bedroom. Follow the directions for cleaning the machine.  When should you call for help?  Call your doctor now or seek immediate medical care if:  ?? You have new or worse trouble swallowing.  ?? Your sore throat gets much worse on one side.  Watch closely for changes in your health, and be sure to contact your doctor if you do not get better as expected.   Where can you learn more?   Go to [employer]  Enter U420 in the search box to learn more about "Sore Throat: Care Instructions."   ?? 2006-2016 Healthwise, Incorporated. Care instructions adapted under license by [employer] (which disclaims liability or warranty for this information). This care instruction is for use with your licensed healthcare professional. If you have questions about a medical condition or this instruction, always ask your healthcare professional. [place] any warranty or liability for your use of this information.  Content Version: 10.8.513193; Current as of: November 01, 2014      Benzocaine/Menthol (By mouth)   Benzocaine (BEN-zoe-kane), Menthol (MEN-thol)  Temporarily relieves sore throat or mouth pain. This medicine is an oral anesthetic.   Brand Name(s):Cepacol Sore Throat, Chloraseptic, Sore Throat, Sore Throat Lozenges, Sore Throat w/Benzocaine, [employer] Sore Throat Lozenges   There may be other brand names for this medicine.  When This Medicine Should Not Be Used:   This medicine  is not right for everyone. Do not use it if you had an allergic reaction to benzocaine, menthol, or other anesthetics.  How to Use This Medicine:   Lozenge  ?? Follow the instructions on the medicine label if you are using this medicine without a prescription.  ?? Allow the lozenge to dissolve slowly in your mouth.  ?? Store the medicine in a closed container at room temperature, away from  heat, moisture, and direct light.  Drugs and Foods to Avoid:     Ask your doctor or pharmacist before using any other medicine, including over-the-counter medicines, vitamins, and herbal products.     Warnings While Using This Medicine:   ?? Tell your doctor if you are pregnant or breastfeeding.  ?? Call your doctor if your symptoms do not improve or if they get worse.  ?? Keep all medicine out of the reach of children. Never share your medicine with anyone.  Possible Side Effects While Using This Medicine:   Call your doctor right away if you notice any of these side effects:  ?? Allergic reaction: Itching or hives, swelling in your face or hands, swelling or tingling in your mouth or throat, chest tightness, trouble breathing  ?? Fever  ?? Headache  ?? Increased irritation, pain, or redness  ?? Nausea or vomiting  ?? Rash or swelling  If you notice other side effects that you think are caused by this medicine, tell your doctor.   Call your doctor for medical advice about side effects. You may report side effects to FDA at 1-800-FDA-1088  ?? 2014 [employer] is for End User's use only and may not be sold, redistributed or otherwise used for commercial purposes.  The above information is an educational aid only. It is not intended as medical advice for individual conditions or treatments. Talk to your doctor, nurse or pharmacist before following any medical regimen to see if it is safe and effective for you.

## 2015-04-17 NOTE — Progress Notes (Signed)
Cameron Coleman is a 54 y.o. African American male and presents with a left throat discomfort.pain even before having an EGD.  He dneies any other upper respiratory symptoms. He has not had any F/U visit yet for his EGD findings.     Chief Complaint   Patient presents with   ??? Neck Pain     Subjective:    Additional Concerns: none     Patient Active Problem List    Diagnosis Date Noted   ??? Elevated blood sugar 10/15/2014   ??? Dyslipidemia 10/15/2014   ??? Ankle swelling 05/01/2014   ??? Elevated blood pressure (not hypertension) 05/01/2014     Current Outpatient Prescriptions   Medication Sig Dispense Refill   ??? Amoxicill-Clarithro-Lansopraz 500-500-30 mg cmpk Take  by mouth.       No Known Allergies  Past Medical History   Diagnosis Date   ??? Arthritis      arthritis and left knee status post drainage x2     No past surgical history on file.  Family History   Problem Relation Age of Onset   ??? Cancer Mother    ??? Diabetes Father    ??? Cancer Father      History   Substance Use Topics   ??? Smoking status: Current Some Day Smoker   ??? Smokeless tobacco: Never Used   ??? Alcohol Use: Yes      Comment: Socially.      ROS     General: negative for - chills, fatigue, fever, weight change  Psych: negative for - anxiety, depression, irritability or mood swings  ENT: negative for - headaches, hearing change, nasal congestion, oral lesions, sneezing,  positive for sore throat  Heme/ Lymph: negative for - bleeding problems, bruising, pallor or swollen lymph nodes  Resp: negative for - cough, shortness of breath or wheezing  CV: negative for - chest pain, edema or palpitations    Objective:  Filed Vitals:    04/17/15 1449   BP: 107/84   Pulse: 57   Temp: 96.7 ??F (35.9 ??C)   TempSrc: Oral   Resp: 16   Height: 6' (1.829 m)   Weight: 286 lb (129.729 kg)   SpO2: 97%   PainSc:   7     PE    Alert, well appearing, and in no distress, oriented to person, place, and time and overweight   Mental status - alert, oriented to person, place, and time, normal mood, behavior, speech, dress, motor activity, and thought processes  Ears - bilateral TM's and external ear canals normal  Nose - normal and patent, no erythema, discharge or polyps  Mouth - mucous membranes moist, pharynx normal without lesions  Neck - supple, no significant adenopathy  Lymphatics - no palpable lymphadenopathy  Chest - clear to auscultation, no wheezes, rales or rhonchi, symmetric air entry  Heart - normal rate, regular rhythm, normal S1, S2, no murmurs, rubs, clicks or gallops    LABS   Office Visit on 11/20/2014   Component Date Value Ref Range Status   ??? Glucose 11/20/2014 87  65 - 99 mg/dL Final   ??? BUN 11/20/2014 13  6 - 24 mg/dL Final   ??? Creatinine 11/20/2014 1.24  0.76 - 1.27 mg/dL Final   ??? GFR est non-AA 11/20/2014 67  >59 mL/min/1.73 Final   ??? GFR est AA 11/20/2014 77  >59 mL/min/1.73 Final   ??? BUN/Creatinine ratio 11/20/2014 10  9 - 20 Final   ??? Sodium 11/20/2014  141  134 - 144 mmol/L Final   ??? Potassium 11/20/2014 4.4  3.5 - 5.2 mmol/L Final   ??? Chloride 11/20/2014 102  97 - 108 mmol/L Final   ??? CO2 11/20/2014 23  18 - 29 mmol/L Final   ??? Calcium 11/20/2014 9.3  8.7 - 10.2 mg/dL Final   ??? Protein, total 11/20/2014 7.4  6.0 - 8.5 g/dL Final   ??? Albumin 11/20/2014 4.4  3.5 - 5.5 g/dL Final   ??? GLOBULIN, TOTAL 11/20/2014 3.0  1.5 - 4.5 g/dL Final   ??? A-G Ratio 11/20/2014 1.5  1.1 - 2.5 Final   ??? Bilirubin, total 11/20/2014 0.7  0.0 - 1.2 mg/dL Final   ??? Alk. phosphatase 11/20/2014 59  39 - 117 IU/L Final   ??? AST 11/20/2014 23  0 - 40 IU/L Final   ??? ALT 11/20/2014 23  0 - 44 IU/L Final   ??? Cholesterol, total 11/20/2014 193  100 - 199 mg/dL Final   ??? Triglyceride 11/20/2014 134  0 - 149 mg/dL Final   ??? HDL Cholesterol 11/20/2014 35* >39 mg/dL Final    Comment: According to ATP-III Guidelines, HDL-C >59 mg/dL is considered a  negative risk factor for CHD.     ??? VLDL, calculated 11/20/2014 27  5 - 40 mg/dL Final    ??? LDL, calculated 11/20/2014 131* 0 - 99 mg/dL Final   ??? Hemoglobin A1c 11/20/2014 6.0* 4.8 - 5.6 % Final    Comment:          Pre-diabetes: 5.7 - 6.4           Diabetes: >6.4           Glycemic control for adults with diabetes: <7.0     ??? INTERPRETATION 11/20/2014 Note   Final    Supplement report is available.       TESTS  Results for orders placed or performed in visit on 31/51/76   METABOLIC PANEL, COMPREHENSIVE   Result Value Ref Range    Glucose 87 65 - 99 mg/dL    BUN 13 6 - 24 mg/dL    Creatinine 1.24 0.76 - 1.27 mg/dL    GFR est non-AA 67 >59 mL/min/1.73    GFR est AA 77 >59 mL/min/1.73    BUN/Creatinine ratio 10 9 - 20    Sodium 141 134 - 144 mmol/L    Potassium 4.4 3.5 - 5.2 mmol/L    Chloride 102 97 - 108 mmol/L    CO2 23 18 - 29 mmol/L    Calcium 9.3 8.7 - 10.2 mg/dL    Protein, total 7.4 6.0 - 8.5 g/dL    Albumin 4.4 3.5 - 5.5 g/dL    GLOBULIN, TOTAL 3.0 1.5 - 4.5 g/dL    A-G Ratio 1.5 1.1 - 2.5    Bilirubin, total 0.7 0.0 - 1.2 mg/dL    Alk. phosphatase 59 39 - 117 IU/L    AST 23 0 - 40 IU/L    ALT 23 0 - 44 IU/L   LIPID PANEL   Result Value Ref Range    Cholesterol, total 193 100 - 199 mg/dL    Triglyceride 134 0 - 149 mg/dL    HDL Cholesterol 35 (L) >39 mg/dL    VLDL, calculated 27 5 - 40 mg/dL    LDL, calculated 131 (H) 0 - 99 mg/dL   HEMOGLOBIN A1C   Result Value Ref Range    Hemoglobin A1c 6.0 (H) 4.8 - 5.6 %  CVD REPORT   Result Value Ref Range    INTERPRETATION Note      Assessment/Plan:      Throat pain in adult. Pharyngitis - RS negative. Throat culture sent. Symptomatic treatment for now. Plan to send for ENT if not better esp if culture showed  negative results for nasopharyngoscopy. Patient will inquire with his GI to see if he needs to see them for this issue.     Lab review: no lab studies available for review at time of visit.     I have discussed the diagnosis with the patient and the intended plan as seen in the above orders.  The patient has received an after-visit summary  and questions were answered concerning future plans.  I have discussed medication side effects and warnings with the patient as well.I have reviewed the plan of care with the patient, accepted their input and they are in agreement with the treatment goals.     F/U as needed    Shelbie Ammons, MD

## 2015-04-18 ENCOUNTER — Inpatient Hospital Stay: Admit: 2015-04-18 | Payer: BLUE CROSS/BLUE SHIELD | Primary: Internal Medicine

## 2015-04-18 DIAGNOSIS — R070 Pain in throat: Secondary | ICD-10-CM

## 2015-04-19 LAB — THROAT CULTURE

## 2015-04-19 MED ORDER — IBUPROFEN 800 MG TAB
800 mg | ORAL_TABLET | Freq: Three times a day (TID) | ORAL | Status: DC | PRN
Start: 2015-04-19 — End: 2018-02-23

## 2015-04-19 NOTE — Progress Notes (Signed)
Quick Note:        Pls inform patient his throat culture is negative for any disease causing bacteria    ______

## 2015-04-19 NOTE — Telephone Encounter (Signed)
Pt called in requesting the below stated medications   Requested Prescriptions     Pending Prescriptions Disp Refills   ??? ibuprofen (MOTRIN) 800 mg tablet 45 Tab 1     Sig: Take 1 Tab by mouth every eight (8) hours as needed for Pain.

## 2015-04-23 NOTE — Telephone Encounter (Signed)
-----   Message from Shelbie Ammons, MD sent at 04/19/2015 12:29 PM EDT -----  Pls inform patient his throat culture is negative for any disease causing bacteria

## 2015-04-23 NOTE — Telephone Encounter (Signed)
Called pt, not available. Will call again.

## 2015-05-22 ENCOUNTER — Encounter: Attending: Internal Medicine | Primary: Internal Medicine

## 2015-06-19 ENCOUNTER — Ambulatory Visit
Admit: 2015-06-19 | Discharge: 2015-06-19 | Payer: PRIVATE HEALTH INSURANCE | Attending: Family Medicine | Primary: Internal Medicine

## 2015-06-19 DIAGNOSIS — M79644 Pain in right finger(s): Secondary | ICD-10-CM

## 2015-06-19 MED ORDER — TRAMADOL 50 MG TAB
50 mg | ORAL_TABLET | Freq: Four times a day (QID) | ORAL | Status: DC | PRN
Start: 2015-06-19 — End: 2016-09-09

## 2015-06-19 NOTE — Progress Notes (Signed)
Cameron Coleman is a 54 y.o. male presents to office today with c/o difficulty bending pointer finger on his right hand. Pt states that this has been going on for about 1 month. Pt states that he doesn't remember injuring finger.

## 2015-06-19 NOTE — Patient Instructions (Addendum)
Hand Pain: Care Instructions  Your Care Instructions  Common causes of hand pain are overuse and injuries, such as might happen during sports or home repair projects. Everyday wear and tear, especially as you get older, also can cause hand pain.  Most minor hand injuries will heal on their own, and home treatment is usually all you need to do. If you have sudden and severe pain, you may need tests and treatment.  Follow-up care is a key part of your treatment and safety. Be sure to make and go to all appointments, and call your doctor if you are having problems. It???s also a good idea to know your test results and keep a list of the medicines you take.  How can you care for yourself at home?  ?? Take pain medicines exactly as directed.  ?? If the doctor gave you a prescription medicine for pain, take it as prescribed.  ?? If you are not taking a prescription pain medicine, ask your doctor if you can take an over-the-counter medicine.  ?? Rest and protect your hand. Take a break from any activity that may cause pain.  ?? Put ice or a cold pack on your hand for 10 to 20 minutes at a time. Put a thin cloth between the ice and your skin.  ?? Prop up the sore hand on a pillow when you ice it or anytime you sit or lie down during the next 3 days. Try to keep it above the level of your heart. This will help reduce swelling.  ?? If your doctor recommends a sling, splint, or elastic bandage to support your hand, wear it as directed.  When should you call for help?  Call 911 anytime you think you may need emergency care. For example, call if:  ?? Your hand turns cool or pale or changes color.  Call your doctor now or seek immediate medical care if:  ?? You cannot move your hand.  ?? Your hand pops, moves out of its normal position, and then returns to its normal position.  ?? You have signs of infection, such as:  ?? Increased pain, swelling, warmth, or redness.  ?? Red streaks leading from the sore area.   ?? Pus draining from a place on your hand.  ?? A fever.  ?? Your hand feels numb or tingly.  Watch closely for changes in your health, and be sure to contact your doctor if:  ?? Your hand feels unstable when you try to use it.  ?? You do not get better as expected.  ?? You have any new symptoms, such as swelling.  ?? Bruises from an injury to your hand last longer than 2 weeks.  Where can you learn more?  Go to http://www.healthwise.net/[employer]Help[employer]ctions  Enter R273 in the search box to learn more about "Hand Pain: Care Instructions."  ?? 2006-2016 Healthwise, Incorporated. Care instructions adapted under license by [employer] (which disclaims liability or warranty for this information). This care instruction is for use with your licensed healthcare professional. If you have questions about a medical condition or this instruction, always ask your healthcare professional. Healthwise, Incorporated disclaims any warranty or liability for your use of this information.  Content Version: 10.9.538570; Current as of: May 03, 2014

## 2015-06-19 NOTE — Progress Notes (Signed)
Cameron Coleman is a 54 y.o. African American male and presents with acute right index finger joint pain with no recent injury or trauma. 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 He has no other joint problem in the past.     Chief Complaint   Patient presents with   ??? Finger Pain     Subjective:    Additional Concerns: none     Patient Active Problem List    Diagnosis Date Noted   ??? Throat pain in adult 04/18/2015   ??? Elevated blood sugar 10/15/2014   ??? Dyslipidemia 10/15/2014   ??? Ankle swelling 05/01/2014   ??? Elevated blood pressure (not hypertension) 05/01/2014     Current Outpatient Prescriptions   Medication Sig Dispense Refill   ??? traMADol (ULTRAM) 50 mg tablet Take 1 Tab by mouth every six (6) hours as needed for Pain. Max Daily Amount: 200 mg. Indications: PAIN 45 Tab 3   ??? ibuprofen (MOTRIN) 800 mg tablet Take 1 Tab by mouth every eight (8) hours as needed for Pain. 45 Tab 1   ??? Amoxicill-Clarithro-Lansopraz 500-500-30 mg cmpk Take  by mouth.       No Known Allergies  Past Medical History   Diagnosis Date   ??? Arthritis      arthritis and left knee status post drainage x2     No past surgical history on file.  Family History   Problem Relation Age of Onset   ??? Cancer Mother    ??? Diabetes Father    ??? Cancer Father      History   Substance Use Topics   ??? Smoking status: Current Some Day Smoker   ??? Smokeless tobacco: Never Used   ??? Alcohol Use: Yes      Comment: Socially.      ROS     General: negative for - chills, fatigue, fever, weight change  MSK: positive for - right index finger joint pain, joint swelling or muscle pain, no locking of finger at this point    Objective:  Filed Vitals:    06/19/15 0825   BP: 146/93   Pulse: 80   Temp: 97.4 ??F (36.3 ??C)   TempSrc: Oral   Resp: 16   Height: 6' (1.829 m)   Weight: 284 lb 9.6 oz (129.094 kg)   SpO2: 98%   PainSc:   5   PainLoc: Finger     PE    Alert, well appearing, and in no distress and oriented to person, place, and time   Mental status - alert, oriented to person, place, and time, normal mood, behavior, speech, dress, motor activity, and thought processes  Right hand/fingers - pain on palpation of the first digit joints, no palmar pain or prominent tendon knot    [facility] Outpatient Visit on 04/17/2015   Component Date Value Ref Range Status   ??? Special Requests: 04/17/2015 NO SPECIAL REQUESTS   Final   ??? Culture result: 04/17/2015 NO BETA HEMOLYTIC STREPTOCOCCUS GROUP A ISOLATED   Final   ??? Culture result: 04/17/2015    Final                    Value:MODERATE  NORMAL RESPIRATORY FLORA     Office Visit on 04/17/2015   Component Date Value Ref Range Status   ??? VALID INTERNAL CONTROL POC 04/17/2015 Yes   Final   ??? Group A Strep Ag 04/17/2015 Negative  Negative Final       TESTS  Results for orders placed or performed during the hospital encounter of 04/17/15   THROAT CULTURE   Result Value Ref Range    Special Requests: NO SPECIAL REQUESTS      Culture result: NO BETA HEMOLYTIC STREPTOCOCCUS GROUP A ISOLATED      Culture result: MODERATE  NORMAL RESPIRATORY FLORA         Assessment/Plan:       Pain in finger of right hand index finger joint - Empiric treatment with Ultram 50 mg po Q6h/PRN for mild to moderate pain.  We will call for results and further plan if any. Plan also to inject when trigger finger is noted in the future.   - XR HAND RT MIN 3 V; Future - Paitent will return for XR    Lab review: no lab studies available for review at time of visit.     I have discussed the diagnosis with the patient and the intended plan as seen in the above orders.  The patient has received an after-visit summary and questions were answered concerning future plans.  I have discussed medication side effects and warnings with the patient as well.I have reviewed the plan of care with the patient, accepted their input and they are in agreement with the treatment goals.     Follow-up Disposition:  Return if symptoms worsen or fail to improve.     Shelbie Ammons, MD

## 2015-11-16 LAB — PLEASE NOTE

## 2015-11-16 LAB — OCCULT BLOOD IMMUNOASSAY,DIAGNOSTIC: Occult blood fecal, by IA: POSITIVE — AB

## 2015-11-26 NOTE — Telephone Encounter (Signed)
Patient has been mailed lab letter in regard to overdue results. Closing encounter.

## 2016-06-23 ENCOUNTER — Ambulatory Visit
Admit: 2016-06-23 | Discharge: 2016-06-23 | Payer: PRIVATE HEALTH INSURANCE | Attending: Internal Medicine | Primary: Internal Medicine

## 2016-06-23 DIAGNOSIS — R030 Elevated blood-pressure reading, without diagnosis of hypertension: Secondary | ICD-10-CM

## 2016-06-23 DIAGNOSIS — R739 Hyperglycemia, unspecified: Secondary | ICD-10-CM

## 2016-06-23 NOTE — Progress Notes (Signed)
HISTORY OF PRESENT ILLNESS  Cameron Coleman is a 55 y.o. male here for follow-up on elevated blood pressure elevated blood sugar dyslipidemia.  Patient is complaining of feeling fatigued.  He is also complaining of decreased libido.  Patient admits to not checking blood pressure or blood sugar.  Blood sugar problem   The history is provided by the patient and medical records. This is a chronic problem. The problem has not changed since onset.Pertinent negatives include no chest pain, no abdominal pain, no headaches and no shortness of breath. The symptoms are aggravated by eating. The symptoms are relieved by medications. He has tried nothing for the symptoms.   Other   The history is provided by the patient and medical records (Patient has history of elevated blood pressure.). This is a chronic problem. The problem has been gradually improving. Pertinent negatives include no chest pain, no abdominal pain, no headaches and no shortness of breath. The symptoms are aggravated by eating. Nothing relieves the symptoms. He has tried nothing for the symptoms.   Sexual Problem   The history is provided by the patient (Patient is complaining of decreased libido). This is a new problem. The problem has not changed since onset.Pertinent negatives include no chest pain, no abdominal pain, no headaches and no shortness of breath. Nothing aggravates the symptoms. Nothing relieves the symptoms. He has tried nothing for the symptoms.   Fatigue   The history is provided by the patient. This is a new problem. Pertinent negatives include no chest pain, no abdominal pain, no headaches and no shortness of breath. Nothing aggravates the symptoms. Nothing relieves the symptoms. He has tried nothing for the symptoms.   Cholesterol Problem   The history is provided by the patient and medical records. This is a chronic problem. The problem has not changed since onset.Pertinent  negatives include no chest pain, no abdominal pain, no headaches and no shortness of breath. The symptoms are aggravated by eating. Nothing relieves the symptoms. He has tried nothing for the symptoms.   No Known Allergies  Current Outpatient Prescriptions on File Prior to Visit   Medication Sig Dispense Refill   ??? ibuprofen (MOTRIN) 800 mg tablet Take 1 Tab by mouth every eight (8) hours as needed for Pain. 45 Tab 1   ??? traMADol (ULTRAM) 50 mg tablet Take 1 Tab by mouth every six (6) hours as needed for Pain. Max Daily Amount: 200 mg. Indications: PAIN 45 Tab 3   ??? Amoxicill-Clarithro-Lansopraz 500-500-30 mg cmpk Take  by mouth.       No current facility-administered medications on file prior to visit.      Past Medical History:   Diagnosis Date   ??? Arthritis     arthritis and left knee status post drainage x2     History reviewed. No pertinent surgical history.  Family History   Problem Relation Age of Onset   ??? Cancer Mother    ??? Diabetes Father    ??? Cancer Father      Social History     Social History   ??? Marital status: MARRIED     Spouse name: N/A   ??? Number of children: N/A   ??? Years of education: N/A     Occupational History   ??? Not on file.     Social History Main Topics   ??? Smoking status: Current Some Day Smoker   ??? Smokeless tobacco: Never Used   ??? Alcohol use Yes  Comment: Socially.    ??? Drug use: No   ??? Sexual activity: Yes     Partners: Female     Birth control/ protection: None     Other Topics Concern   ??? Military Service No   ??? Blood Transfusions No   ??? Caffeine Concern No   ??? 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    ??? Sleep Concern No   ??? Stress Concern No   ??? Weight Concern No   ??? Special Diet No   ??? Back Care No   ??? Exercise No   ??? Bike Helmet Yes   ??? Seat Belt Yes   ??? Self-Exams Yes     Social History Narrative         Review of Systems   Constitutional: Positive for fatigue and malaise/fatigue.   Eyes: Negative.     Respiratory: Negative.  Negative for shortness of breath.    Cardiovascular: Negative.  Negative for chest pain.   Gastrointestinal: Negative for abdominal pain.   Musculoskeletal: Negative.    Neurological: Negative.  Negative for headaches.   Endo/Heme/Allergies: Negative.    Psychiatric/Behavioral: Negative.      Visit Vitals   ??? BP 120/80 (BP 1 Location: Right arm, BP Patient Position: Sitting)   ??? Pulse 62   ??? Temp 98.2 ??F (36.8 ??C) (Oral)   ??? Resp 18   ??? Ht 6' (1.829 m)   ??? Wt 283 lb 3.2 oz (128.5 kg)   ??? SpO2 98%   ??? BMI 38.41 kg/m2       Physical Exam   Constitutional: He is oriented to person, place, and time. He appears well-developed and well-nourished.   HENT:   Head: Normocephalic and atraumatic.   Cardiovascular: Normal rate, regular rhythm, normal heart sounds and intact distal pulses.  Exam reveals no gallop and no friction rub.    No murmur heard.  Pulmonary/Chest: Effort normal and breath sounds normal. No respiratory distress. He has no wheezes. He has no rales.   Musculoskeletal: Normal range of motion. He exhibits no edema, tenderness or deformity.   Neurological: He is alert and oriented to person, place, and time. No cranial nerve deficit. Coordination normal.   Skin: Skin is warm and dry. No rash noted. No erythema. No pallor.   Psychiatric: He has a normal mood and affect. His behavior is normal. Judgment and thought content normal.       ASSESSMENT and PLAN    ICD-10-CM ICD-9-CM    1. Elevated blood pressure reading without diagnosis of hypertension [id number] METABOLIC PANEL, COMPREHENSIVE      URINALYSIS W/ RFLX MICROSCOPIC   2. Dyslipidemia [id number] METABOLIC PANEL, COMPREHENSIVE      LIPID PANEL   3. Elevated blood sugar R73.9 790.29 HEMOGLOBIN A1C WITH EAG      CBC WITH AUTOMATED DIFF      METABOLIC PANEL, COMPREHENSIVE      MICROALBUMIN, UR, RAND W/ MICROALBUMIN/CREA RATIO      URINALYSIS W/ RFLX MICROSCOPIC   4. Fatigue, unspecified type R53.83 780.79 CBC WITH AUTOMATED DIFF       METABOLIC PANEL, COMPREHENSIVE      VITAMIN D, 25 HYDROXY   5. Decreased libido R68.82 799.81 TESTOSTERONE, FREE & TOTAL     Follow-up Disposition:  Return in about 4 weeks (around 07/21/2016).

## 2016-06-23 NOTE — Progress Notes (Signed)
Cameron Coleman is a 55 y.o. male presents to office for elevated blood pressure.       1. Have you been to the ER, urgent care clinic or hospitalized since your last visit? no  2. Have you seen any other providers outside of [facility] since your last visit? no  3. Have you had a Flu shot this year? no      Health Maintenance items with a due date reviewed with patient:  Health Maintenance Due   Topic Date Due   ??? Hepatitis C Screening  1961/06/23   ??? Pneumococcal 19-64 Medium Risk (1 of 1 - PPSV23) 11/20/1982   ??? DTaP/Tdap/Td series (1 - Tdap) 11/20/1984

## 2016-06-24 ENCOUNTER — Inpatient Hospital Stay: Admit: 2016-06-24 | Payer: BLUE CROSS/BLUE SHIELD | Primary: Internal Medicine

## 2016-06-24 LAB — METABOLIC PANEL, COMPREHENSIVE
A-G Ratio: 1.1 (ref 0.8–1.7)
ALT (SGPT): 33 U/L (ref 16–61)
AST (SGOT): 21 U/L (ref 15–37)
Albumin: 4 g/dL (ref 3.4–5.0)
Alk. phosphatase: 61 U/L (ref 45–117)
Anion gap: 5 mmol/L (ref 3.0–18)
BUN/Creatinine ratio: 11 — ABNORMAL LOW (ref 12–20)
BUN: 13 MG/DL (ref 7.0–18)
Bilirubin, total: 0.3 MG/DL (ref 0.2–1.0)
CO2: 26 mmol/L (ref 21–32)
Calcium: 8.6 MG/DL (ref 8.5–10.1)
Chloride: 105 mmol/L (ref 100–108)
Creatinine: 1.18 MG/DL (ref 0.6–1.3)
GFR est AA: >60 mL/min/{1.73_m2} (ref >60)
GFR est non-AA: >60 mL/min/{1.73_m2} (ref >60)
Globulin: 3.7 g/dL (ref 2.0–4.0)
Glucose: 103 mg/dL — ABNORMAL HIGH (ref 74–99)
Potassium: 4.2 mmol/L (ref 3.5–5.5)
Protein, total: 7.7 g/dL (ref 6.4–8.2)
Sodium: 136 mmol/L (ref 136–145)

## 2016-06-24 LAB — CBC WITH AUTOMATED DIFF
ABS. BASOPHILS: 0 10*3/uL (ref 0.0–0.06)
ABS. EOSINOPHILS: 0.1 10*3/uL (ref 0.0–0.4)
ABS. LYMPHOCYTES: 2.9 10*3/uL (ref 0.9–3.6)
ABS. MONOCYTES: 0.4 10*3/uL (ref 0.05–1.2)
ABS. NEUTROPHILS: 2.1 10*3/uL (ref 1.8–8.0)
BASOPHILS: 0 % (ref 0–2)
EOSINOPHILS: 2 % (ref 0–5)
HCT: 43.1 % (ref 36.0–48.0)
HGB: 14.6 g/dL (ref 13.0–16.0)
LYMPHOCYTES: 53 % — ABNORMAL HIGH (ref 21–52)
MCH: 30.5 PG (ref 24.0–34.0)
MCHC: 33.9 g/dL (ref 31.0–37.0)
MCV: 90.2 FL (ref 74.0–97.0)
MONOCYTES: 7 % (ref 3–10)
MPV: 11.2 FL (ref 9.2–11.8)
NEUTROPHILS: 38 % — ABNORMAL LOW (ref 40–73)
PLATELET: 173 10*3/uL (ref 135–420)
RBC: 4.78 M/uL (ref 4.70–5.50)
RDW: 15.2 % — ABNORMAL HIGH (ref 11.6–14.5)
WBC: 5.4 10*3/uL (ref 4.6–13.2)

## 2016-06-24 LAB — LIPID PANEL
CHOL/HDL Ratio: 6.3 — ABNORMAL HIGH (ref 0–5.0)
Cholesterol, total: 209 MG/DL — ABNORMAL HIGH (ref <200)
HDL Cholesterol: 33 MG/DL — ABNORMAL LOW (ref 40–60)
LDL, calculated: 127.4 MG/DL — ABNORMAL HIGH (ref 0–100)
Triglyceride: 243 MG/DL — ABNORMAL HIGH (ref <150)
VLDL, calculated: 48.6 MG/DL

## 2016-06-24 LAB — MICROALBUMIN, UR, RAND W/ MICROALB/CREAT RATIO
Creatinine, urine random: 336.64 mg/dL — ABNORMAL HIGH (ref 30–125)
Microalbumin,urine random: 0.7 MG/DL (ref 0–3.0)
Microalbumin/Creat ratio (mg/g creat): 2 mg/g (ref 0–30)

## 2016-06-24 LAB — URINALYSIS W/ RFLX MICROSCOPIC
Bilirubin: NEGATIVE
Blood: NEGATIVE
Glucose: NEGATIVE mg/dL
Leukocyte Esterase: NEGATIVE
Nitrites: NEGATIVE
Protein: NEGATIVE mg/dL
Specific gravity: >1.03 — ABNORMAL HIGH (ref 1.005–1.030)
Urobilinogen: 1 EU/dL (ref 0.2–1.0)
pH (UA): 5.5 (ref 5.0–8.0)

## 2016-06-24 LAB — HEMOGLOBIN A1C WITH EAG
Est. average glucose: 134 mg/dL
Hemoglobin A1c: 6.3 % — ABNORMAL HIGH (ref 4.2–5.6)

## 2016-06-24 LAB — TESTOSTERONE, FREE & TOTAL
Free testosterone (Direct): 5.5 pg/mL — ABNORMAL LOW (ref 7.2–24.0)
Testosterone: 345 ng/dL — ABNORMAL LOW (ref 348–1197)

## 2016-06-24 LAB — VITAMIN D, 25 HYDROXY: Vitamin D 25-Hydroxy: 24.9 ng/mL — ABNORMAL LOW (ref 30–100)

## 2016-06-28 NOTE — Telephone Encounter (Signed)
Pt calling to follow up on results of labs. Please advise.

## 2016-07-06 NOTE — Telephone Encounter (Signed)
Please advise re: lab results. 06/23/16  Thank you.

## 2016-07-06 NOTE — Telephone Encounter (Signed)
Many of his lab results are abnormal nothing life-threatening or urgent.  However I feel that he should make a follow-up appointment so we can sit down and discuss lab results and his treatment options

## 2016-07-06 NOTE — Telephone Encounter (Signed)
LVM will call again.

## 2016-07-07 NOTE — Telephone Encounter (Signed)
2nd attempt, sending a letter. Closing encounter.

## 2016-07-22 ENCOUNTER — Encounter: Attending: Internal Medicine | Primary: Internal Medicine

## 2016-09-09 ENCOUNTER — Ambulatory Visit
Admit: 2016-09-09 | Discharge: 2016-09-09 | Payer: PRIVATE HEALTH INSURANCE | Attending: Urology | Primary: Internal Medicine

## 2016-09-09 DIAGNOSIS — N529 Male erectile dysfunction, unspecified: Secondary | ICD-10-CM

## 2016-09-09 LAB — AMB POC URINALYSIS DIP STICK AUTO W/O MICRO
Bilirubin (UA POC): NEGATIVE
Blood (UA POC): NEGATIVE
Glucose (UA POC): NEGATIVE
Leukocyte esterase (UA POC): NEGATIVE
Nitrites (UA POC): NEGATIVE
Protein (UA POC): NEGATIVE mg/dL
Specific gravity (UA POC): 1.025 (ref 1.001–1.035)
Urobilinogen (UA POC): 1 (ref 0.2–1)
pH (UA POC): 5.5 (ref 4.6–8.0)

## 2016-09-09 NOTE — Progress Notes (Signed)
09/09/2016     Chief Complaint   Patient presents with   ??? Sexual Problem     New patient to Dr. Marthann Schiller referred per [facility]. for consult. Patient states that he has noticed a steady decline in energy level and libido over the last couple of years. Patient assoc. issue with aging however, would like to discuss treatment options. Erectile Dysfunction also reported as erections are not as rigid as they once were. Trial Viagra in past beneficial.          ??? Erectile Dysfunction     History of present Illness:    Bronn Altidor is a 55 y.o. male who presents today for ED consult and has been referred by Karie Mainland, MD.     Patient reports progressive ED for the past few years. He has difficulty attaining fully rigid erections and maintaining throughout intercourse. He has occasional nocturnal erections, but less frequent. He tried Viagra in the past which worked well. He has not seen PCP for ED issues. Reports normal libido and good energy levels. No prior GU trauma. No penile curvature. No voiding complaints. Voiding with a reasonable FOS. No dysuria or gross hematuria.   Admits that he does not maintain a healthy diet or work out as often as he should.      BP high during triage. Notable high cholesterol on recent lab work.   Smokes cigars   Patient's BMI is out of the normal parameters.  Information about BMI was given to the patient.      Review of Systems  Constitutional: Fever: No  Skin: Rash: No  HEENT: Hearing difficulty: No  Eyes: Blurred vision: No  Cardiovascular: Chest pain: No  Respiratory: Shortness of breath: No  Gastrointestinal: Nausea/vomiting: No  Musculoskeletal: Back pain: No  Neurological: Weakness: No  Psychological: Memory loss: No  Comments/additional findings:        Past Medical History:   Diagnosis Date   ??? Arthritis     arthritis and left knee status post drainage x2   ??? Decreased libido    ??? Erectile dysfunction      History reviewed. No pertinent surgical history.   Social History     Social History   ??? Marital status: MARRIED     Spouse name: N/A   ??? Number of children: N/A   ??? Years of education: N/A     Occupational History   ??? Not on file.     Social History Main Topics   ??? Smoking status: Current Some Day Smoker     Types: Cigars   ??? Smokeless tobacco: Never Used      Comment: occ cigar smoker   ??? Alcohol use Yes      Comment: Socially.    ??? Drug use: No   ??? Sexual activity: Yes     Partners: Female     Birth control/ protection: None     Other Topics Concern   ??? Military Service No   ??? Blood Transfusions No   ??? Caffeine Concern No   ??? 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    ??? Sleep Concern No   ??? Stress Concern No   ??? Weight Concern No   ??? Special Diet No   ??? Back Care No   ??? Exercise No   ??? Bike Helmet Yes   ??? Seat Belt Yes   ??? Self-Exams Yes  Social History Narrative     Family History   Problem Relation Age of Onset   ??? Cancer Mother    ??? Diabetes Father    ??? Cancer Father      Current Outpatient Prescriptions on File Prior to Visit   Medication Sig Dispense Refill   ??? ibuprofen (MOTRIN) 800 mg tablet Take 1 Tab by mouth every eight (8) hours as needed for Pain. 45 Tab 1     No current facility-administered medications on file prior to visit.      No Known Allergies       Physical exam:      Visit Vitals   ??? BP 150/80   ??? Ht 6\' 2"  (1.88 m)   ??? Wt 275 lb (124.7 kg)   ??? BMI 35.31 kg/m2     Constitutional: WDWN, Pleasant and appropriate affect, No acute distress.    CV:  No peripheral swelling noted  Respiratory: No respiratory distress or difficulties  Abdomen:  No abdominal  tenderness.,   GU Male:  No CVA tenderness.   Skin: Normal color and texture and No rashes or erythema noted  Neuro/Psych:  Alert and Oriented x3, affect appropriate.   Lymphatic:   No enlarged supraclavicular  lymph nodes.      Results for orders placed or performed in visit on 09/09/16   AMB POC URINALYSIS DIP STICK AUTO W/O MICRO   Result Value Ref Range     Color (UA POC) Yellow     Clarity (UA POC) Clear     Glucose (UA POC) Negative Negative    Bilirubin (UA POC) Negative Negative    Ketones (UA POC) Trace Negative    Specific gravity (UA POC) 1.025 1.001 - 1.035    Blood (UA POC) Negative Negative    pH (UA POC) 5.5 4.6 - 8.0    Protein (UA POC) Negative Negative mg/dL    Urobilinogen (UA POC) 1 mg/dL 0.2 - 1    Nitrites (UA POC) Negative Negative    Leukocyte esterase (UA POC) Negative Negative          Assessment:        ICD-10-CM ICD-9-CM    1. Erectile dysfunction, unspecified erectile dysfunction type N52.9 607.84 AMB POC URINALYSIS DIP STICK AUTO W/O MICRO             Plan:  It has been known for some time that erectile dysfunction (ED) and cardiovascular disease (CVD) share both etiologic and pathophysiologic characteristics. Patients with ED have a significantly greater risk of having a cardiovascular event (angina, miocardial infarct, and stroke) than those without ED. Erectile dysfunction shares a number of risk factors with cardiovascular disease(CVD), such as age over 50 years, sedentary lifestyle, history of smoking, diabetes mellitus, and metabolic syndrome (hypertension, dyslipidemia, elevated glucose, and central obesity). Recent findings, moreover, point towards the use of ED as a predictor of CVD, which accounts for nearly 40% of deaths in the [place]. In a study involving 52 men with coronary artery disease (CAD), almost all patients experienced ED symptoms on an average of three years before their CAD become symptomatic.   Recommend discussing cardiac eval with PCP.     Long conversation with the patient regarding etiology and treatment options for ED   Treatment discussed included doing nothing, meds, VED, PEP, MUSE and IPP  Risks and benefits of each were reviewed  All questions were answered    Samples of Viagra and Cialis provided. SEs and dosing reviewed  Plan FU       Joanne Chars, MD            [place] documentation provided with the assistance of [facility], medical scribe to [employer]

## 2017-07-18 ENCOUNTER — Telehealth

## 2017-07-18 NOTE — Telephone Encounter (Signed)
Please see message below and pended referral. Thank you!

## 2017-07-18 NOTE — Telephone Encounter (Signed)
Pt needs a new referral to be sent to a Gastro dr for a colonoscopy. Pt does not have a preference as to who he wants to see but [place]

## 2017-12-08 ENCOUNTER — Encounter: Attending: Internal Medicine | Primary: Internal Medicine

## 2017-12-08 ENCOUNTER — Ambulatory Visit
Admit: 2017-12-08 | Discharge: 2017-12-08 | Payer: PRIVATE HEALTH INSURANCE | Attending: Family Medicine | Primary: Internal Medicine

## 2017-12-08 DIAGNOSIS — J310 Chronic rhinitis: Secondary | ICD-10-CM

## 2017-12-08 MED ORDER — FLUTICASONE 50 MCG/ACTUATION NASAL SPRAY, SUSP
50 mcg/actuation | Freq: Every day | NASAL | 6 refills | Status: DC
Start: 2017-12-08 — End: 2017-12-08

## 2017-12-08 MED ORDER — AZELASTINE 137 MCG NASAL SPRAY AEROSOL
137 mcg (0.1 %) | Freq: Two times a day (BID) | NASAL | 6 refills | Status: DC
Start: 2017-12-08 — End: 2018-02-23

## 2017-12-08 MED ORDER — FLUTICASONE 50 MCG/ACTUATION NASAL SPRAY, SUSP
50 mcg/actuation | Freq: Every day | NASAL | 6 refills | Status: DC
Start: 2017-12-08 — End: 2018-02-23

## 2017-12-08 MED ORDER — AZELASTINE 137 MCG NASAL SPRAY AEROSOL
137 mcg (0.1 %) | Freq: Two times a day (BID) | NASAL | 6 refills | Status: DC
Start: 2017-12-08 — End: 2017-12-08

## 2017-12-08 NOTE — Addendum Note (Signed)
Addended by: Verlene Mayer D on: 12/08/2017 02:39 PM     Modules accepted: Level of Service

## 2017-12-08 NOTE — Progress Notes (Signed)
1. Have you been to the ER, urgent care clinic since your last visit?  Hospitalized since your last visit?No    2. Have you seen or consulted any other health care providers outside of the [facility] since your last visit?  Include any pap smears or colon screening. No     After offering flu vaccine, patient refused at this time.     Chief Complaint   Patient presents with   ??? Sinus Pain     3 weeks ago     Visit Vitals  BP 137/64   Pulse 90   Temp 95.5 ??F (35.3 ??C) (Oral)   Resp 20   Ht 6\' 2"  (1.88 m)   Wt 288 lb (130.6 kg)   SpO2 96%   BMI 36.98 kg/m??

## 2017-12-08 NOTE — Progress Notes (Signed)
[facility]    CC: Sinus Pain    HPI:     - Timing/onset: 2-3 weeks ago  - Location: Right frontal  - Context: Was preceded by a cold  - Progression/Course: Worsening  - Associated Symptoms/signs: Stuffy nose, runny nose, post nasal drip, cough 2/2 post nasal drip,  occasional sneezing  - Tried: Sudafed (x 1 week) and Tylenol  - Alleviating factors: Sudafed  - Exposures: Possible sick contacts ay work      ROS: Positive items marked in RED  CON: fever, chills  Cardiovascular: palpitations, CP  Resp: SOB, cough (See HPI)  GI: nausea, vomiting, diarrhea  GU: dysuria, hematuria      Past Medical History:   Diagnosis Date   ??? Arthritis     arthritis and left knee status post drainage x2   ??? Decreased libido    ??? Erectile dysfunction        No past surgical history on file.    Family History   Problem Relation Age of Onset   ??? Cancer Mother    ??? Diabetes Father    ??? Cancer Father        Social History     Socioeconomic History   ??? Marital status: MARRIED     Spouse name: Not on file   ??? Number of children: Not on file   ??? Years of education: Not on file   ??? Highest education level: Not on file   Tobacco Use   ??? Smoking status: Current Some Day Smoker     Types: Cigars   ??? Smokeless tobacco: Never Used   ??? Tobacco comment: occ cigar smoker   Substance and Sexual Activity   ??? Alcohol use: Yes     Comment: Socially.    ??? Drug use: No   ??? Sexual activity: Yes     Partners: Female     Birth control/protection: None   Other Topics Concern   ??? Military Service No   ??? Blood Transfusions No   ??? Caffeine Concern No   ??? Occupational Exposure No   ??? 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    ??? Sleep Concern No   ??? Stress Concern No   ??? Weight Concern No   ??? Special Diet No   ??? Back Care No   ??? Exercise No   ??? Bike Helmet Yes   ??? Seat Belt Yes   ??? Self-Exams Yes       No Known Allergies      Current Outpatient Medications:   ???  ibuprofen (MOTRIN) 800 mg tablet, Take 1 Tab by mouth every eight (8)  hours as needed for Pain., Disp: 45 Tab, Rfl: 1    Physical Exam:      BP 137/64    Pulse 90    Temp 95.5 ??F (35.3 ??C) (Oral)    Resp 20    Ht 6\' 2"  (1.88 m)    Wt 288 lb (130.6 kg)    SpO2 96%    BMI 36.98 kg/m??     General: obese habitus, NAD, conversant  Eyes: sclera clear bilaterally, no discharge noted, eyelids normal in appearance  HENT: NCAT, nasal turbinates significantly enlarged bilaterally, mild right fontal sinus tenderness, oropharynx with cobblestoning, MMM  Skin: normal temperature, turgor, color, and texture  Psych: alert and oriented to person, place and situation, normal affect  Neuro: speech normal, moving all extremities, gait normal  Assessment/Plan     Rhinosinusitis:  - Allergic vs viral etiology  - Patient counseled on diagnosis and treatment recommendations  - Will start on Flonase and Azelastine regimen  - Handouts given on sinusitis care and sinus rinse  - F/U in one week as needed if symptoms fail to improve or worsen        Theotis Burrow, MD  12/08/2017, 9:52 AM

## 2017-12-08 NOTE — Patient Instructions (Signed)
Sinusitis: Care Instructions  Your Care Instructions    Sinusitis is an infection of the lining of the sinus cavities in your head. Sinusitis often follows a cold. It causes pain and pressure in your head and face.  In most cases, sinusitis gets better on its own in 1 to 2 weeks. But some mild symptoms may last for several weeks. Sometimes antibiotics are needed.  Follow-up care is a key part of your treatment and safety. Be sure to make and go to all appointments, and call your doctor if you are having problems. It's also a good idea to know your test results and keep a list of the medicines you take.  How can you care for yourself at home?  ?? Take an over-the-counter pain medicine, such as acetaminophen (Tylenol), ibuprofen (Advil, Motrin), or naproxen (Aleve). Read and follow all instructions on the label.  ?? If the doctor prescribed antibiotics, take them as directed. Do not stop taking them just because you feel better. You need to take the full course of antibiotics.  ?? Be careful when taking over-the-counter cold or flu medicines and Tylenol at the same time. Many of these medicines have acetaminophen, which is Tylenol. Read the labels to make sure that you are not taking more than the recommended dose. Too much acetaminophen (Tylenol) can be harmful.  ?? Breathe warm, moist air from a steamy shower, a hot bath, or a sink filled with hot water. Avoid cold, dry air. Using a humidifier in your home may help. Follow the directions for cleaning the machine.  ?? Use saline (saltwater) nasal washes to help keep your nasal passages open and wash out mucus and bacteria. You can buy saline nose drops at a grocery store or drugstore. Or you can make your own at home by adding 1 teaspoon of salt and 1 teaspoon of baking soda to 2 cups of distilled water. If you make your own, fill a bulb syringe with the solution, insert the tip into your nostril, and squeeze gently. Blow your nose.   ?? Put a hot, wet towel or a warm gel pack on your face 3 or 4 times a day for 5 to 10 minutes each time.  ?? Try a decongestant nasal spray like oxymetazoline (Afrin). Do not use it for more than 3 days in a row. Using it for more than 3 days can make your congestion worse.  When should you call for help?  Call your doctor now or seek immediate medical care if:  ?? ?? You have new or worse swelling or redness in your face or around your eyes.   ?? ?? You have a new or higher fever.   ??Watch closely for changes in your health, and be sure to contact your doctor if:  ?? ?? You have new or worse facial pain.   ?? ?? The mucus from your nose becomes thicker (like pus) or has new blood in it.   ?? ?? You are not getting better as expected.   Where can you learn more?  Go to http://www.healthwise.net/GoodHelpConnections.  Enter I933 in the search box to learn more about "Sinusitis: Care Instructions."  Current as of: March 09, 2017  Content Version: 11.8  ?? 2006-2018 [employer]. Care instructions adapted under license by [employer] (which disclaims liability or warranty for this information). If you have questions about a medical condition or this instruction, always ask your healthcare professional. Healthwise, Incorporated disclaims any warranty or liability for your   use of this information.         Saline Nasal Washes: Care Instructions  Your Care Instructions  Saline nasal washes help keep the nasal passages open by washing out thick or dried mucus. This simple remedy can help relieve symptoms of allergies, sinusitis, and colds. It also can make the nose feel more comfortable by keeping the mucous membranes moist. You may notice a little burning sensation in your nose the first few times you use the solution, but this usually gets better in a few days.  Follow-up care is a key part of your treatment and safety. Be sure to make and go to all appointments, and call your doctor if you are having  problems. It's also a good idea to know your test results and keep a list of the medicines you take.  How can you care for yourself at home?  ?? You can buy premixed saline solution in a squeeze bottle or other sinus rinse products at a drugstore. Read and follow the instructions on the label.  ?? You also can make your own saline solution by adding 1 teaspoon of salt and 1 teaspoon of baking soda to 2 cups of distilled water.  ?? If you use a homemade solution, pour a small amount into a clean bowl. Using a rubber bulb syringe, squeeze the syringe and place the tip in the salt water. Pull a small amount of the salt water into the syringe by relaxing your hand.  ?? Sit down with your head tilted slightly back. Do not lie down. Put the tip of the bulb syringe or the squeeze bottle a little way into one of your nostrils. Gently drip or squirt a few drops into the nostril. Repeat with the other nostril. Some sneezing and gagging are normal at first.  ?? Gently blow your nose.  ?? Wipe the syringe or bottle tip clean after each use.  ?? Repeat this 2 or 3 times a day.  ?? Use nasal washes gently if you have nosebleeds often.  When should you call for help?  Watch closely for changes in your health, and be sure to contact your doctor if:  ?? ?? You often get nosebleeds.   ?? ?? You have problems doing the nasal washes.   Where can you learn more?  Go to http://www.healthwise.net/GoodHelpConnections.  Enter B784 in the search box to learn more about "Saline Nasal Washes: Care Instructions."  Current as of: March 09, 2017  Content Version: 11.8  ?? 2006-2018 [employer]. Care instructions adapted under license by [employer] (which disclaims liability or warranty for this information). If you have questions about a medical condition or this instruction, always ask your healthcare professional. [employer] Inc[employer] disclaims any warranty or liability for your use of this information.

## 2018-02-08 ENCOUNTER — Ambulatory Visit
Admit: 2018-02-08 | Discharge: 2018-02-08 | Payer: PRIVATE HEALTH INSURANCE | Attending: Internal Medicine | Primary: Internal Medicine

## 2018-02-08 DIAGNOSIS — R030 Elevated blood-pressure reading, without diagnosis of hypertension: Secondary | ICD-10-CM

## 2018-02-08 MED ORDER — ROSUVASTATIN 10 MG TAB
10 mg | ORAL_TABLET | Freq: Every evening | ORAL | 1 refills | Status: DC
Start: 2018-02-08 — End: 2019-08-30

## 2018-02-08 MED ORDER — IRBESARTAN 75 MG TAB
75 mg | ORAL_TABLET | Freq: Every evening | ORAL | 1 refills | Status: DC
Start: 2018-02-08 — End: 2018-06-14

## 2018-02-08 NOTE — Progress Notes (Addendum)
HISTORY OF PRESENT ILLNESS  Cameron Coleman is a 57 y.o. male here for evaluation because of an abnormal EKG obtained in [place].  Patient presented to an urgent care with complaints of swollen lower extremities.  He denies chest pain shortness of breath or pain in his legs.  It was noted that his EKG was suggestive of anterior lateral ischemia which prompted his visit today.  Risk factors include prediabetes elevated blood pressure and hyperlipidemia..  Leg Swelling   The history is provided by the patient. This is a new problem. The problem has been gradually improving. Pertinent negatives include no chest pain, no abdominal pain, no headaches and no shortness of breath. Nothing aggravates the symptoms. Nothing relieves the symptoms. Treatments tried: Lasix. The treatment provided significant relief.   Cholesterol Problem   The history is provided by the medical records. This is a chronic problem. The problem has not changed since onset.Pertinent negatives include no chest pain, no abdominal pain, no headaches and no shortness of breath. The symptoms are aggravated by eating. Nothing relieves the symptoms. He has tried nothing for the symptoms.   High Blood Sugar   The history is provided by the medical records. This is a chronic problem. The problem has not changed since onset.Pertinent negatives include no chest pain, no abdominal pain, no headaches and no shortness of breath. The symptoms are aggravated by eating. Nothing relieves the symptoms. He has tried nothing for the symptoms.   Other   The history is provided by the medical records. This is a new problem. The problem has not changed since onset.Pertinent negatives include no chest pain, no abdominal pain, no headaches and no shortness of breath. Nothing aggravates the symptoms. Nothing relieves the symptoms. He has tried nothing for the symptoms.     No Known Allergies  Current Outpatient Medications on File Prior to Visit    Medication Sig Dispense Refill   ??? furosemide (LASIX) 40 mg tablet Take 40 mg by mouth daily.     ??? azelastine (ASTELIN) 137 mcg (0.1 %) nasal spray 1 Spray by Both Nostrils route two (2) times a day. Use in each nostril as directed 1 Bottle 6   ??? fluticasone (FLONASE) 50 mcg/actuation nasal spray 2 Sprays by Both Nostrils route daily. 1 Bottle 6   ??? ibuprofen (MOTRIN) 800 mg tablet Take 1 Tab by mouth every eight (8) hours as needed for Pain. 45 Tab 1     No current facility-administered medications on file prior to visit.      Past Medical History:   Diagnosis Date   ??? Arthritis     arthritis and left knee status post drainage x2   ??? Decreased libido    ??? Erectile dysfunction      No past surgical history on file.  Family History   Problem Relation Age of Onset   ??? Cancer Mother    ??? Diabetes Father    ??? Cancer Father      Social History     Socioeconomic History   ??? Marital status: MARRIED     Spouse name: Not on file   ??? Number of children: Not on file   ??? Years of education: Not on file   ??? Highest education level: Not on file   Social Needs   ??? Financial resource strain: Not on file   ??? Food insecurity - worry: Not on file   ??? Food insecurity - inability: Not on file   ??? Transportation  needs - medical: Not on file   ??? Transportation needs - non-medical: Not on file   Occupational History   ??? Not on file   Tobacco Use   ??? Smoking status: Current Some Day Smoker     Types: Cigars   ??? Smokeless tobacco: Never Used   ??? Tobacco comment: occ cigar smoker   Substance and Sexual Activity   ??? Alcohol use: Yes     Comment: Socially.    ??? Drug use: No   ??? Sexual activity: Yes     Partners: Female     Birth control/protection: None   Other Topics Concern   ??? Military Service No   ??? Blood Transfusions No   ??? Caffeine Concern No   ??? Occupational Exposure No   ??? 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    ??? Sleep Concern No   ??? Stress Concern No   ??? Weight Concern No   ??? Special Diet No   ??? Back Care No    ??? Exercise No   ??? Bike Helmet Yes   ??? Seat Belt Yes   ??? Self-Exams Yes   Social History Narrative   ??? Not on file       Review of Systems   Constitutional: Negative.    Eyes: Negative.    Respiratory: Negative.  Negative for shortness of breath.    Cardiovascular: Negative.  Negative for chest pain.   Gastrointestinal: Negative for abdominal pain.   Musculoskeletal: Negative.    Neurological: Negative.  Negative for headaches.   Endo/Heme/Allergies: Negative.    Psychiatric/Behavioral: Negative.      Visit Vitals  BP 142/76 (BP 1 Location: Left arm, BP Patient Position: Sitting)   Pulse 86   Temp 96.7 ??F (35.9 ??C) (Oral)   Resp 16   Ht 6\' 2"  (1.88 m)   Wt 285 lb 3.2 oz (129.4 kg)   SpO2 97%   BMI 36.62 kg/m??       Physical Exam   Constitutional: He is oriented to person, place, and time. He appears well-developed and well-nourished.   HENT:   Head: Normocephalic and atraumatic.   Cardiovascular: Normal rate, regular rhythm, normal heart sounds and intact distal pulses. Exam reveals no gallop and no friction rub.   No murmur heard.  Pulmonary/Chest: Effort normal and breath sounds normal. No respiratory distress. He has no wheezes. He has no rales.   Musculoskeletal: Normal range of motion. He exhibits no edema or tenderness.   Neurological: He is alert and oriented to person, place, and time. No cranial nerve deficit. Coordination normal.   Skin: Skin is warm and dry. No rash noted. No erythema. No pallor.   Psychiatric: He has a normal mood and affect. His behavior is normal. Thought content normal.   Nursing note and vitals reviewed.      ASSESSMENT and PLAN    ICD-10-CM ICD-9-CM    1. Elevated blood pressure reading without diagnosis of hypertension R03.0 796.2 AMB POC EKG ROUTINE W/ 12 LEADS, INTER & REP   2. Ankle swelling, unspecified laterality M25.473 719.07    3. Dyslipidemia E78.5 272.4    4. Prediabetes R73.03 790.29    5. Abnormal EKG R94.31 794.31 REFERRAL TO CARDIOLOGY     Follow-up Disposition:   Return in about 2 weeks (around 02/22/2018).  Patient has been started on Avapro 75 mg daily and has been instructed to continue Lasix

## 2018-02-08 NOTE — Progress Notes (Signed)
Cameron Coleman is a 57 y.o. male (DOB: February 27, 1961) presenting to address:    Chief Complaint   Patient presents with   ??? Leg Swelling   ??? Cholesterol Problem   ??? Elevated Blood Pressure       Vitals:    02/08/18 1616   BP: 142/76   Pulse: 86   Resp: 16   Temp: 96.7 ??F (35.9 ??C)   TempSrc: Oral   SpO2: 97%   Weight: 285 lb 3.2 oz (129.4 kg)   Height: 6\' 2"  (1.88 m)   PainSc:   0 - No pain       Hearing/Vision:   No exam data present    Learning Assessment:     Learning Assessment 02/08/2018   PRIMARY LEARNER Patient   PRIMARY LANGUAGE ENGLISH   LEARNER PREFERENCE PRIMARY DEMONSTRATION     PICTURES     VIDEOS     READING   ANSWERED BY patient   RELATIONSHIP SELF     Depression Screening:     3 most recent PHQ Screens 02/08/2018   Little interest or pleasure in doing things Not at all   Feeling down, depressed, irritable, or hopeless Not at all   Total Score PHQ 2 0     Fall Risk Assessment:     Fall Risk Assessment, last 12 mths 02/08/2018   Able to walk? Yes   Fall in past 12 months? No     Abuse Screening:     Abuse Screening Questionnaire 02/08/2018   Do you ever feel afraid of your partner? N   Are you in a relationship with someone who physically or mentally threatens you? N   Is it safe for you to go home? Y     Coordination of Care Questionaire:   1. Have you been to the ER, urgent care clinic since your last visit?  Hospitalized since your last visit? Yes     2. Have you seen or consulted any other health care providers outside of the [facility] since your last visit?  Include any pap smears or colon screening. no

## 2018-02-23 ENCOUNTER — Ambulatory Visit
Admit: 2018-02-23 | Discharge: 2018-02-23 | Payer: PRIVATE HEALTH INSURANCE | Attending: Internal Medicine | Primary: Internal Medicine

## 2018-02-23 DIAGNOSIS — R030 Elevated blood-pressure reading, without diagnosis of hypertension: Secondary | ICD-10-CM

## 2018-02-23 NOTE — Progress Notes (Signed)
Cameron Coleman is a 57 y.o. male (DOB: May 25, 1961) presenting to address:    Chief Complaint   Patient presents with   ??? Hypertension     follow up   ??? Cholesterol Problem     follow up       Vitals:    02/23/18 1508   BP: 110/62   Pulse: 73   Resp: 17   Temp: 97 ??F (36.1 ??C)   TempSrc: Temporal   SpO2: 97%   Weight: 281 lb (127.5 kg)   Height: 6\' 2"  (1.88 m)   PainSc:   0 - No pain       Hearing/Vision:   No exam data present    Learning Assessment:     Learning Assessment 02/23/2018   PRIMARY LEARNER Patient   PRIMARY LANGUAGE ENGLISH   LEARNER PREFERENCE PRIMARY PICTURES     LISTENING     VIDEOS     DEMONSTRATION   ANSWERED BY patient   RELATIONSHIP SELF     Depression Screening:     3 most recent PHQ Screens 02/08/2018   Little interest or pleasure in doing things Not at all   Feeling down, depressed, irritable, or hopeless Not at all   Total Score PHQ 2 0     Fall Risk Assessment:     Fall Risk Assessment, last 12 mths 02/08/2018   Able to walk? Yes   Fall in past 12 months? No     Abuse Screening:     Abuse Screening Questionnaire 02/08/2018   Do you ever feel afraid of your partner? N   Are you in a relationship with someone who physically or mentally threatens you? N   Is it safe for you to go home? Y     Coordination of Care Questionaire:   1. Have you been to the ER, urgent care clinic since your last visit?  Hospitalized since your last visit? NO    2. Have you seen or consulted any other health care providers outside of the [facility] since your last visit?  Include any pap smears or colon screening. NO

## 2018-02-23 NOTE — Progress Notes (Signed)
HISTORY OF PRESENT ILLNESS  Cameron Coleman is a 57 y.o. male here for follow-up on hypertension hyperlipidemia chest pain and leg swelling.  Patient had PVL and stress test done with cardiology.  Stress test has been reported negative.  Blood pressure is better controlled.  States he has not had any more chest pain.Marland Kitchen  Hypertension    The history is provided by the medical records. This is a new problem. The problem has been gradually improving. Pertinent negatives include no orthopnea, no peripheral edema and no dizziness. There are no associated agents to hypertension. Risk factors include male gender.   Cholesterol Problem   The history is provided by the medical records. This is a chronic problem. The problem has not changed since onset.The symptoms are aggravated by eating. Nothing relieves the symptoms. He has tried nothing for the symptoms.   Chest Pain    The history is provided by the patient and medical records. This is a new problem. The problem has been resolved. Pertinent negatives include no dizziness, no lower extremity edema and no orthopnea. He has tried nothing for the symptoms. Risk factors include male gender. His past medical history is significant for HTN.His past medical history does not include DM, DVT, PE or CHF. Procedural history includes stress echo.Pertinent negatives include no cardiac catheterization.  Leg Swelling   The history is provided by the patient. This is a new problem. The problem has been resolved. Nothing aggravates the symptoms. Nothing relieves the symptoms. Treatments tried: Lasix. The treatment provided significant relief.   High Blood Sugar   The history is provided by the medical records and patient. This is a chronic problem. The problem has been rapidly improving. The symptoms are aggravated by eating. Nothing relieves the symptoms. He has tried nothing for the symptoms.     No Known Allergies  Current Outpatient Medications on File Prior to Visit    Medication Sig Dispense Refill   ??? irbesartan (AVAPRO) 75 mg tablet Take 1 Tab by mouth nightly. 90 Tab 1   ??? rosuvastatin (CRESTOR) 10 mg tablet Take 1 Tab by mouth nightly. 90 Tab 1     No current facility-administered medications on file prior to visit.      Past Medical History:   Diagnosis Date   ??? Arthritis     arthritis and left knee status post drainage x2   ??? Decreased libido    ??? Erectile dysfunction      No past surgical history on file.  Family History   Problem Relation Age of Onset   ??? Cancer Mother    ??? Diabetes Father    ??? Cancer Father      Social History     Socioeconomic History   ??? Marital status: MARRIED     Spouse name: Not on file   ??? Number of children: Not on file   ??? Years of education: Not on file   ??? Highest education level: Not on file   Social Needs   ??? Financial resource strain: Not on file   ??? Food insecurity - worry: Not on file   ??? Food insecurity - inability: Not on file   ??? Transportation needs - medical: Not on file   ??? Transportation needs - non-medical: Not on file   Occupational History   ??? Not on file   Tobacco Use   ??? Smoking status: Current Some Day Smoker     Types: Cigars   ??? Smokeless tobacco: Never Used   ???  Tobacco comment: occ cigar smoker   Substance and Sexual Activity   ??? Alcohol use: Yes     Comment: Socially.    ??? Drug use: No   ??? Sexual activity: Yes     Partners: Female     Birth control/protection: None   Other Topics Concern   ??? Military Service No   ??? Blood Transfusions No   ??? Caffeine Concern No   ??? Occupational Exposure No   ??? 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    ??? Sleep Concern No   ??? Stress Concern No   ??? Weight Concern No   ??? Special Diet No   ??? Back Care No   ??? Exercise No   ??? Bike Helmet Yes   ??? Seat Belt Yes   ??? Self-Exams Yes   Social History Narrative   ??? Not on file       Review of Systems   Constitutional: Negative.    Eyes: Negative.    Respiratory: Negative.    Cardiovascular: Negative.  Negative for orthopnea.    Musculoskeletal: Negative.    Neurological: Negative.  Negative for dizziness.   Endo/Heme/Allergies: Negative.    Psychiatric/Behavioral: Negative.      Visit Vitals  BP 110/62 (BP 1 Location: Right arm, BP Patient Position: Sitting)   Pulse 73   Temp 97 ??F (36.1 ??C) (Temporal)   Resp 17   Ht 6\' 2"  (1.88 m)   Wt 281 lb (127.5 kg)   SpO2 97%   BMI 36.08 kg/m??       Physical Exam   Constitutional: He is oriented to person, place, and time. He appears well-developed and well-nourished.   HENT:   Head: Normocephalic and atraumatic.   Cardiovascular: Normal rate, regular rhythm, normal heart sounds and intact distal pulses. Exam reveals no gallop and no friction rub.   No murmur heard.  Pulmonary/Chest: Effort normal and breath sounds normal. No respiratory distress. He has no wheezes. He has no rales.   Musculoskeletal: Normal range of motion. He exhibits no edema or tenderness.   Neurological: He is alert and oriented to person, place, and time. No cranial nerve deficit. Coordination normal.   Skin: Skin is warm and dry. No rash noted. No erythema. No pallor.   Psychiatric: He has a normal mood and affect. His behavior is normal. Thought content normal.   Nursing note and vitals reviewed.      ASSESSMENT and PLAN    ICD-10-CM ICD-9-CM    1. Elevated blood pressure reading without diagnosis of hypertension R03.0 796.2    2. Ankle swelling, unspecified laterality M25.473 719.07    3. Elevated blood sugar R73.9 790.29    4. Dyslipidemia E78.5 272.4    5. Chest pain, unspecified type R07.9 786.50      Follow-up Disposition:  Return in about 2 months (around 04/25/2018).

## 2018-04-26 ENCOUNTER — Encounter: Attending: Internal Medicine | Primary: Internal Medicine

## 2018-06-14 NOTE — Telephone Encounter (Addendum)
Pt requesting by 06/19/18. [place] for work.    Pt calling to request medication   be sent to [facility], [place].     Pts last appt was 02/23/18, next appt is not scheduled.  Advised pt of 72 hour time frame for refill requests.     Requested Prescriptions     Pending Prescriptions Disp Refills   ??? irbesartan (AVAPRO) 75 mg tablet 90 Tab 1     Sig: Take 1 Tab by mouth nightly.   ??? furosemide (LASIX) 40 mg tablet       Sig: Take 1 Tab by mouth daily.

## 2018-06-19 MED ORDER — IRBESARTAN 75 MG TAB
75 mg | ORAL_TABLET | Freq: Every evening | ORAL | 1 refills | Status: AC
Start: 2018-06-19 — End: ?

## 2019-06-13 LAB — PSA, DIAGNOSTIC (PROSTATE SPECIFIC AG): Prostate Specific Ag: 5.33 NG/ML — AB (ref 0.0–4.0)

## 2019-06-13 LAB — PSA PROSTATIC SPECIFIC ANTIGEN: PSA: 5.33 ng/mL — AB (ref 0.0–4.0)

## 2019-06-14 LAB — VITAMIN D, 25 HYDROXY: Vit D 25-Hydroxy: 36.5 NG/ML (ref 30.0–100.0)

## 2019-06-14 LAB — VITAMIN D 25 HYDROXY: Vit D, 25-Hydroxy: 36.5 NG/ML (ref 30.0–100.0)

## 2019-06-14 LAB — HEMOGLOBIN A1C
Estimated Avg Glucose, External: 126 mg/dL — ABNORMAL HIGH (ref 91–123)
Hemoglobin A1C, External: 6 % — ABNORMAL HIGH (ref 4.8–5.6)

## 2019-08-10 ENCOUNTER — Ambulatory Visit
Admit: 2019-08-10 | Discharge: 2019-08-10 | Payer: PRIVATE HEALTH INSURANCE | Attending: Urology | Primary: Family Medicine

## 2019-08-10 ENCOUNTER — Ambulatory Visit: Attending: Urology | Primary: Family Medicine

## 2019-08-10 DIAGNOSIS — R972 Elevated prostate specific antigen [PSA]: Secondary | ICD-10-CM

## 2019-08-10 LAB — AMB POC URINALYSIS DIP STICK AUTO W/O MICRO
Bilirubin (UA POC): NEGATIVE
Bilirubin, Urine, POC: NEGATIVE
Blood (UA POC): NEGATIVE
Blood (UA POC): NEGATIVE
Glucose (UA POC): NEGATIVE
Glucose, Urine, POC: NEGATIVE
Leukocyte Esterase, Urine, POC: NEGATIVE
Leukocyte esterase (UA POC): NEGATIVE
Nitrite, Urine, POC: NEGATIVE
Nitrites (UA POC): NEGATIVE
Protein (UA POC): NEGATIVE
Protein, Urine, POC: NEGATIVE
Specific Gravity, Urine, POC: 1.03 NA (ref 1.001–1.035)
Specific gravity (UA POC): 1.03 (ref 1.001–1.035)
Urobilinogen (UA POC): 0.2 (ref 0.2–1)
Urobilinogen, POC: 0.2 (ref 0.2–1)
pH (UA POC): 5.5 (ref 4.6–8.0)
pH, Urine, POC: 5.5 NA (ref 4.6–8.0)

## 2019-08-10 LAB — AMB POC PVR, MEAS,POST-VOID RES,US,NON-IMAGING
PVR POC: 0 cc
PVR: 0 cc

## 2019-08-10 MED ORDER — LEVOFLOXACIN 750 MG TAB
750 mg | ORAL_TABLET | ORAL | 0 refills | Status: DC
Start: 2019-08-10 — End: 2019-08-30

## 2019-08-10 NOTE — Progress Notes (Signed)
Cameron Coleman  DOB: May 26, 1961  Encounter Date:  08/10/2019    Encounter Diagnoses     ICD-10-CM ICD-9-CM   1. Elevated PSA  R97.20 790.93   2. Nocturia  R35.1 788.43   3. Family history of prostate cancer in father  Z56.42 V51.42       ASSESSMENT:   1. Elevated PSA. Most recently 5.33ng/mL on 06/13/19. PSA elevation since 2015 when level was 2.3ng/mL.     2. Positive family h/o prostate cancer in his father (diagnosed at age 58s)    48. Hx ED- seen in 2017 by Dr. Marthann Schiller. Takes OTC supplement with good response    PLAN:  PSA 06/13/19 reviewed - 5.33ng/mL.   Repeat PSA next week with free and total- will inform. Advised to refrain from sexual activity at least 48 hours prior to PSA draws (pt was sexually active last night)  Pt is scheduled for colonoscopy next Friday- he is traveling to [place] on 9/15 x 6 months for work and will come back in December for a short time  Scheduled prostate biopsy (given age and PSA elevation and rise since 2015) in December, but if repeat PSA has dropped significantly, may postpone and do closer PSA surveillance. Kit provided to pt  Levaquin 772m to take 2 hours prior to prostate biopsy  Follow up pending PSA results     DISCUSSION: I have discussed PSA elevation today in detail with the patient. The PSA test can detect high levels of PSA that may indicate the presence of prostate cancer. However, many other benign conditions, such as an enlarged or inflamed prostate, can also increase PSA levels. Although the PSA test can detect high levels of PSA in the blood, the test doesn't provide precise diagnostic information about the condition of the prostate. The PSA test is only one tool used to screen for early signs of prostate cancer.     According to the NCCN guidelines (2.2015) for men age 56-75with normal DRE and PSA >1 to repeat PSAs at 1-2 year intervals. The Prostate Cancer Prevention Tail demonstrated that 15% of men with a PSA leveal of <4.0  ng/ml and a normal DRE had prostate cancer diagnosed on end-of-study biopsies. Approximately 30-35% of men with serum PSA between 4-10 ng/ml will be found to have cancer. Total PSA levels  10 ng/ml confer a greater then 67% likelihood of prostate cancer.     The natural history of prostate cancer and ongoing controversy regarding screening and potential treatment outcomes of prostate cancer has been discussed with the patient. The meaning of a false positive PSA and a false negative PSA has been discussed. He indicates understanding of the limitations of this screening test.      Risks of prostate biopsy were discussed: these include bleeding ,with bowel movements or with urination that may last for up to 2-6 weeks, in semen for a longer period of time, infection possible sepsis, discomfort. Explained possible need for hospitalization, approx 1%, and need for surgical intervention or iv antibiotics.       Patient's BMI is out of the normal parameters.  Information about BMI was given and patient was advised to follow-up with their PCP for further management.    Chief Complaint   Patient presents with   ??? Elevated PSA     Pt ref here by his PCP to discuss PSA level,Pt states he has family history of prostae ca- Dad       HISTORY OF PRESENT ILLNESS:  Cameron Coleman is a 58 y.o. male who presents today in consultation for elevated PSA and has been referred by Dr. Maudie Mercury Kushner-Griffin. He is accompanied by his wife. He saw Dr. Marthann Schiller in 09/09/2016 for ED.    Patient reports minimal urinary symptoms, taking no GU meds.     AUA questions  Nocturia  0  Frequency  no  Urgency  no  Dysuria  no  Hesitancy  non  Sense of PVR  no  FOS   Good    PVR today is 0 ml    Denies gross hematuria     Asymptomatic for urinary infection.      Hx ED. Saw Dr. Marthann Schiller in 2017. Given trial of Cialis and Viagra  Uses [facility]  He is able to attain full erection, able to penetrate, maintain and ejaculate   No history of heart disease.     He has family history of prostate cancer in his father, diagnosed early 17s (dec 39s)    He is scheduled for colonoscopy next Friday- h/o adenomatous polyps 5 years ago    He is traveling to [place] on 9/15 x 6 months      Labs/Radiology:  PSA Trend  Component Prostate Specific Ag   Latest Ref Rng  0.0 - 4.0 NG/ML   06/13/2019 5.330 (A)   05/01/2014 2.3       Results for orders placed or performed in visit on 08/10/19   AMB POC PVR, MEAS,POST-VOID RES,US,NON-IMAGING   Result Value Ref Range    PVR 0 cc   AMB POC URINALYSIS DIP STICK AUTO W/O MICRO   Result Value Ref Range    Color (UA POC) Yellow     Clarity (UA POC) Clear     Glucose (UA POC) Negative Negative    Bilirubin (UA POC) Negative Negative    Ketones (UA POC) Trace Negative    Specific gravity (UA POC) 1.030 1.001 - 1.035    Blood (UA POC) Negative Negative    pH (UA POC) 5.5 4.6 - 8.0    Protein (UA POC) Negative Negative    Urobilinogen (UA POC) 0.2 mg/dL 0.2 - 1    Nitrites (UA POC) Negative Negative    Leukocyte esterase (UA POC) Negative Negative         Past Medical History:   Diagnosis Date   ??? Arthritis     arthritis and left knee status post drainage x2   ??? Decreased libido    ??? Erectile dysfunction    ??? HTN (hypertension)      History reviewed. No pertinent surgical history.  Family History   Problem Relation Age of Onset   ??? Cancer Mother    ??? Diabetes Father    ??? Prostate Cancer Father      Social History     Socioeconomic History   ??? Marital status: MARRIED     Spouse name: Not on file   ??? Number of children: Not on file   ??? Years of education: Not on file   ??? Highest education level: Not on file   Occupational History   ??? Not on file   Social Needs   ??? Financial resource strain: Not on file   ??? Food insecurity     Worry: Not on file     Inability: Not on file   ??? Transportation needs     Medical: Not on file     Non-medical: Not on file  Tobacco Use   ??? Smoking status: Current Some Day Smoker      Types: Cigars   ??? Smokeless tobacco: Never Used   ??? Tobacco comment: occ cigar smoker   Substance and Sexual Activity   ??? Alcohol use: Yes     Comment: Socially.    ??? Drug use: No   ??? Sexual activity: Yes     Partners: Female     Birth control/protection: None   Lifestyle   ??? Physical activity     Days per week: Not on file     Minutes per session: Not on file   ??? Stress: Not on file   Relationships   ??? Social Product manager on phone: Not on file     Gets together: Not on file     Attends religious service: Not on file     Active member of club or organization: Not on file     Attends meetings of clubs or organizations: Not on file     Relationship status: Not on file   ??? Intimate partner violence     Fear of current or ex partner: Not on file     Emotionally abused: Not on file     Physically abused: Not on file     Forced sexual activity: Not on file   Other Topics Concern   ??? Military Service No   ??? Blood Transfusions No   ??? Caffeine Concern No   ??? Occupational Exposure No   ??? 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    ??? Sleep Concern No   ??? Stress Concern No   ??? Weight Concern No   ??? Special Diet No   ??? Back Care No   ??? Exercise No   ??? Bike Helmet Yes   ??? Seat Belt Yes   ??? Self-Exams Yes   Social History Narrative   ??? Not on file     No Known Allergies  Current Outpatient Medications   Medication Sig Dispense Refill   ??? furosemide (LASIX) 40 mg tablet Take 40 mg by mouth daily as needed.     ??? levoFLOXacin (Levaquin) 750 mg tablet Take 2 hours prior to biopsy 1 Tab 0   ??? irbesartan (AVAPRO) 75 mg tablet Take 1 Tab by mouth nightly. 90 Tab 1   ??? clotrimazole (LOTRIMIN) 1 % topical cream Apply  to affected area two (2) times a day.     ??? rosuvastatin (CRESTOR) 10 mg tablet Take 1 Tab by mouth nightly. 90 Tab 1       Review of Systems  Constitutional: Fever: No  Skin: Rash: No  HEENT: Hearing difficulty: No  Eyes: Blurred vision: No  Cardiovascular: Chest pain: No   Respiratory: Shortness of breath: No  Gastrointestinal: Nausea/vomiting: No  Musculoskeletal: Back pain: No  Neurological: Weakness: No  Psychological: Memory loss: No  Comments/additional findings:     Physical Examination:  Visit Vitals  Ht 6' 2"  (1.88 m)   Wt 277 lb (125.6 kg)   BMI 35.56 kg/m??      Constitutional: WDWN, Pleasant and appropriate affect, No acute distress.    Head: Normocephalic and atraumatic.   Eyes: No discharge. No scleral icterus.   Neck: Normal range of motion. Neck supple. No JVD present. No tracheal deviation present.   Pulmonary/Chest: Effort normal. No respiratory distress.   Musc:normal gait and station  Psych: normal affect and mood, well groomed, appropriate answers,  A&Ox3  Skin: Skin is warm and dry.   GU Male (08/10/2019):      Scrotum:   normal   Epididymides: nontender, symmetric    Testes:   Nontender, symmetric, no masses   Urethral Meatus:   No stenosis   Penis:  normal   Foreskin:  circumcised   Anus/Perineum:  normal   Spinchter tone:  normal   Rectum:  No masses   Prostate:  Apex benign     CC: Shuck-Griffin, Joelene Millin, MD     Olevia Perches, NP  [facility] [place] documentation is provided with the assistance of Sabrina A. Delfenthal, medical scribe for Olevia Perches, NP on 08/10/2019       Chart reviewed   I agree with assessment and plan as outlined  Mal Amabile, MD

## 2019-08-10 NOTE — Progress Notes (Signed)
Progress Notes by Olevia Perches, NP at 08/10/19 0930                Author: Olevia Perches, NP  Service: --  Author Type: Nurse Practitioner       Filed: 08/14/19 0912  Encounter Date: 08/10/2019  Status: Signed          Editor: Mal Amabile, MD (Physician)                    Cameron Coleman   DOB: May 12, 1961   Encounter Date:  08/10/2019        Encounter Diagnoses              ICD-10-CM  ICD-9-CM          1.  Elevated PSA   R97.20  790.93     2.  Nocturia   R35.1  788.43          3.  Family history of prostate cancer in father   Z68.42  V27.42           ASSESSMENT:    1. Elevated PSA. Most recently 5.33ng/mL on 06/13/19. PSA elevation since 2015 when level was 2.3ng/mL.       2. Positive family h/o prostate cancer in his father (diagnosed at age 56s)      24. Hx ED- seen in 2017 by Dr. Marthann Schiller. Takes OTC supplement with good response      PLAN:   PSA 06/13/19 reviewed - 5.33ng/mL.    Repeat PSA next week with free and total- will inform. Advised to refrain from sexual activity at least 48 hours prior to PSA draws (pt was sexually active last night)   Pt is scheduled for colonoscopy next Friday- he is traveling to [place] on 9/15 x 6 months for work and will come back in December for a short time   Scheduled prostate biopsy (given age and PSA elevation and rise since 2015) in December, but if repeat PSA has dropped significantly, may postpone and do closer PSA surveillance. Kit provided to pt   Levaquin 79m to take 2 hours prior to prostate biopsy   Follow up pending PSA results       DISCUSSION: I have discussed PSA elevation today in detail with the patient. The PSA test can detect high  levels of PSA that may indicate the presence of prostate cancer. However, many other benign conditions, such as an enlarged or inflamed prostate, can also increase PSA levels. Although the PSA test can detect high levels of PSA in the blood, the test  doesn't provide precise diagnostic information about the condition  of the prostate. The PSA test is only one tool used to screen for early signs of prostate cancer.       According to the NCCN guidelines (2.2015) for men age 787-75with normal DRE and PSA > 1 to repeat PSAs at 1-2 year intervals. The Prostate Cancer Prevention Tail demonstrated that 15% of men with a PSA leveal of <4.0 ng/ml and  a normal DRE had prostate cancer diagnosed on end-of-study biopsies. Approximately 30-35% of men with serum PSA between 4-10 ng/ml will be found to have cancer. Total PSA levels  10 ng/ml confer a greater then 67% likelihood of prostate cancer.       The natural history of prostate cancer and ongoing controversy regarding screening and potential treatment outcomes of prostate cancer has been discussed with the  patient. The meaning of a false positive  PSA and a false negative PSA has been discussed. He indicates understanding of the limitations of this screening test.        Risks of prostate biopsy were discussed: these include bleeding ,with bowel movements or with urination that may last for up to 2-6 weeks, in semen  for a longer period of time, infection possible sepsis, discomfort. Explained possible need for hospitalization, approx 1%, and need for surgical intervention or iv antibiotics.          Patient's BMI is out of the normal parameters.  Information about BMI was given and patient was advised to follow-up with their PCP for further management.        Chief Complaint       Patient presents with        ?  Elevated PSA             Pt ref here by his PCP to discuss PSA level,Pt states he has family history of prostae ca- Dad           HISTORY OF PRESENT ILLNESS:   Cameron Coleman  is a 58 y.o. male who presents  today in consultation for elevated PSA and has been referred by Dr. Maudie Mercury Nuccio-Griffin. He is accompanied by his wife. He saw Dr. Marthann Schiller in 09/09/2016 for ED.      Patient reports minimal urinary symptoms, taking no GU meds.       AUA questions   Nocturia  0    Frequency  no   Urgency  no   Dysuria  no   Hesitancy  non   Sense of PVR  no   FOS   Good      PVR today is 0 ml      Denies gross hematuria       Asymptomatic for urinary infection.        Hx ED. Saw Dr. Marthann Schiller in 2017. Given trial of Cialis and Viagra   Uses [facility]   He is able to attain full erection, able to penetrate, maintain and ejaculate   No history of heart disease.       He has family history of prostate cancer in his father, diagnosed early 42s (dec 19s)      He is scheduled for colonoscopy next Friday- h/o adenomatous polyps 5 years ago      He is traveling to [place] on 9/15 x 6 months         Labs/Radiology:   PSA Trend      Component  Prostate Specific Ag     Latest Ref Rng   0.0 - 4.0 NG/ML        06/13/2019  5.330 (A)        05/01/2014  2.3             Results for orders placed or performed in visit on 08/10/19     AMB POC PVR, MEAS,POST-VOID RES,US,NON-IMAGING         Result  Value  Ref Range            PVR  0  cc       AMB POC URINALYSIS DIP STICK AUTO W/O MICRO         Result  Value  Ref Range            Color (UA POC)  Yellow         Clarity (UA POC)  Clear  Glucose (UA POC)  Negative  Negative       Bilirubin (UA POC)  Negative  Negative       Ketones (UA POC)  Trace  Negative       Specific gravity (UA POC)  1.030  1.001 - 1.035       Blood (UA POC)  Negative  Negative       pH (UA POC)  5.5  4.6 - 8.0       Protein (UA POC)  Negative  Negative       Urobilinogen (UA POC)  0.2 mg/dL  0.2 - 1       Nitrites (UA POC)  Negative  Negative            Leukocyte esterase (UA POC)  Negative  Negative                Past Medical History:        Diagnosis  Date         ?  Arthritis            arthritis and left knee status post drainage x2         ?  Decreased libido       ?  Erectile dysfunction           ?  HTN (hypertension)          History reviewed. No pertinent surgical history.     Family History         Problem  Relation  Age of Onset          ?  Cancer  Mother        ?  Diabetes  Father            ?  Prostate Cancer  Father            Social History          Socioeconomic History         ?  Marital status:  MARRIED              Spouse name:  Not on file         ?  Number of children:  Not on file     ?  Years of education:  Not on file     ?  Highest education level:  Not on file       Occupational History        ?  Not on file       Social Needs         ?  Financial resource strain:  Not on file        ?  Food insecurity              Worry:  Not on file         Inability:  Not on file        ?  Transportation needs              Medical:  Not on file         Non-medical:  Not on file       Tobacco Use         ?  Smoking status:  Current Some Day Smoker              Types:  Cigars         ?  Smokeless tobacco:  Never Used        ?  Tobacco comment: occ cigar smoker       Substance and Sexual Activity         ?  Alcohol use:  Yes             Comment: Socially.          ?  Drug use:  No     ?  Sexual activity:  Yes              Partners:  Female         Birth control/protection:  None       Lifestyle        ?  Physical activity              Days per week:  Not on file         Minutes per session:  Not on file         ?  Stress:  Not on file       Relationships        ?  Social Health visitor on phone:  Not on file         Gets together:  Not on file         Attends religious service:  Not on file         Active member of club or organization:  Not on file         Attends meetings of clubs or organizations:  Not on file         Relationship status:  Not on file        ?  Intimate partner violence              Fear of current or ex partner:  Not on file         Emotionally abused:  Not on file         Physically abused:  Not on file         Forced sexual activity:  Not on file        Other Topics  Concern         ?  Military Service  No     ?  Blood Transfusions  No     ?  Caffeine Concern  No     ?  Occupational Exposure  No     ?  Hobby Hazards  Yes              Comment: Theme park manager.          ?  Sleep Concern  No     ?  Stress Concern  No     ?  Weight Concern  No     ?  Special Diet  No     ?  Back Care  No     ?  Exercise  No     ?  Bike Helmet  Yes     ?  Seat Belt  Yes     ?  Self-Exams  Yes       Social History Narrative        ?  Not on file        No Known Allergies     Current Outpatient Medications          Medication  Sig  Dispense  Refill           ?  furosemide (  LASIX) 40 mg tablet  Take 40 mg by mouth daily as needed.         ?  levoFLOXacin (Levaquin) 750 mg tablet  Take 2 hours prior to biopsy  1 Tab  0     ?  irbesartan (AVAPRO) 75 mg tablet  Take 1 Tab by mouth nightly.  90 Tab  1     ?  clotrimazole (LOTRIMIN) 1 % topical cream  Apply  to affected area two (2) times a day.               ?  rosuvastatin (CRESTOR) 10 mg tablet  Take 1 Tab by mouth nightly.  90 Tab  1           Review of Systems   Constitutional: Fever: No   Skin: Rash: No   HEENT: Hearing difficulty: No   Eyes: Blurred vision: No   Cardiovascular: Chest pain: No   Respiratory: Shortness of breath: No   Gastrointestinal: Nausea/vomiting: No   Musculoskeletal: Back pain: No   Neurological: Weakness: No   Psychological: Memory loss: No   Comments/additional findings:       Physical Examination:   Visit Vitals      Ht  '6\' 2"'  (1.88 m)     Wt  277 lb (125.6 kg)        BMI  35.56 kg/m??         Constitutional: WDWN, Pleasant and appropriate affect, No acute distress.     Head: Normocephalic and atraumatic.    Eyes: No discharge. No scleral icterus.    Neck: Normal range of motion. Neck supple. No JVD present. No tracheal deviation present.    Pulmonary/Chest: Effort normal. No respiratory distress.    Musc:normal gait and station   Psych: normal affect and mood, well groomed, appropriate answers, A&Ox3   Skin: Skin is warm and dry.    GU Male (08/10/2019):       Scrotum:   normal    Epididymides: nontender, symmetric     Testes:   Nontender, symmetric, no masses    Urethral Meatus:   No  stenosis    Penis:  normal    Foreskin:  circumcised    Anus/Perineum:  normal    Spinchter tone:  normal    Rectum:  No masses    Prostate:  Apex benign       CC: Edick-Griffin, Joelene Millin, MD       Olevia Perches, NP   [facility] [place] documentation is provided with the assistance of Sabrina A. Delfenthal, medical scribe for Olevia Perches, NP on  08/10/2019          Chart reviewed    I agree with assessment and plan as outlined   Mal Amabile, MD

## 2019-08-15 ENCOUNTER — Institutional Professional Consult (permissible substitution): Admit: 2019-08-15 | Payer: PRIVATE HEALTH INSURANCE | Primary: Family Medicine

## 2019-08-15 DIAGNOSIS — R972 Elevated prostate specific antigen [PSA]: Secondary | ICD-10-CM

## 2019-08-15 NOTE — Telephone Encounter (Signed)
Error

## 2019-08-15 NOTE — Progress Notes (Signed)
Cameron Coleman presents today for lab draw per Dr. Janee Morn order.   Dr. Donovan Kail was present in the clinic as incident to.     PSA obtained via venipuncture without any difficulty.    Patient will be notified with lab results.       Orders Placed This Encounter   ??? PSA TOTAL+% FREE   ??? COLLECTION VENOUS BLOOD,VENIPUNCTURE       Lenise Arena

## 2019-08-15 NOTE — Progress Notes (Signed)
 Cameron Coleman presents today for lab draw per Dr. Saundra order.   Dr. Goldin was present in the clinic as incident to.     PSA obtained via venipuncture without any difficulty.    Patient will be notified with lab results.       Orders Placed This Encounter   . PSA TOTAL+% FREE   . COLLECTION VENOUS BLOOD,VENIPUNCTURE       [name]

## 2019-08-16 LAB — PSA TOTAL+% FREE
PSA, % Free: 5.7 %
PSA, Free Pct: 5.7 %
PSA, Free: 0.31 ng/mL
PSA, Free: 0.31 ng/mL
PSA: 5.4 ng/mL — ABNORMAL HIGH (ref 0.0–4.0)
Prostate Specific Ag: 5.4 ng/mL — ABNORMAL HIGH (ref 0.0–4.0)

## 2019-08-22 NOTE — Telephone Encounter (Signed)
Pt informed of the results of his psa - stable but high    He will be travelling to [facility] for 6 months    Please try to get him scheduled for a prostate biopsy before he leaves - ideally with me or with any MD if needs to    Mal Amabile, MD

## 2019-08-30 ENCOUNTER — Ambulatory Visit
Admit: 2019-08-30 | Discharge: 2019-08-30 | Payer: PRIVATE HEALTH INSURANCE | Attending: Urology | Primary: Family Medicine

## 2019-08-30 ENCOUNTER — Ambulatory Visit: Attending: Urology | Primary: Family Medicine

## 2019-08-30 DIAGNOSIS — R351 Nocturia: Secondary | ICD-10-CM

## 2019-08-30 LAB — AMB POC URINALYSIS DIP STICK AUTO W/O MICRO
Bilirubin (UA POC): NEGATIVE
Bilirubin, Urine, POC: NEGATIVE
Blood (UA POC): NEGATIVE
Blood (UA POC): NEGATIVE
Glucose (UA POC): NEGATIVE
Glucose, Urine, POC: NEGATIVE
Leukocyte Esterase, Urine, POC: NEGATIVE
Leukocyte esterase (UA POC): NEGATIVE
Nitrite, Urine, POC: NEGATIVE
Nitrites (UA POC): NEGATIVE
Specific Gravity, Urine, POC: 1.025 NA (ref 1.001–1.035)
Specific gravity (UA POC): 1.025 (ref 1.001–1.035)
Urobilinogen (UA POC): 1 (ref 0.2–1)
Urobilinogen, POC: 1 (ref 0.2–1)
pH (UA POC): 7 (ref 4.6–8.0)
pH, Urine, POC: 7 NA (ref 4.6–8.0)

## 2019-08-30 MED ORDER — CIPROFLOXACIN 500 MG TAB
500 mg | ORAL_TABLET | Freq: Two times a day (BID) | ORAL | 0 refills | Status: DC
Start: 2019-08-30 — End: 2021-04-02

## 2019-08-30 MED ORDER — CEFTRIAXONE 1 GRAM SOLUTION FOR INJECTION
1 gram | Freq: Once | INTRAMUSCULAR | 0 refills | Status: AC
Start: 2019-08-30 — End: 2019-08-30

## 2019-08-30 NOTE — Progress Notes (Signed)
HI-RES TRANSRECTAL ULTRASOUND AND PROSTATE NEEDLE BIOPSY NOTE      Current Outpatient Medications on File Prior to Visit   Medication Sig Dispense Refill   ??? furosemide (LASIX) 40 mg tablet Take 40 mg by mouth daily as needed.     ??? irbesartan (AVAPRO) 75 mg tablet Take 1 Tab by mouth nightly. 90 Tab 1   ??? [DISCONTINUED] clotrimazole (LOTRIMIN) 1 % topical cream Apply  to affected area two (2) times a day.     ??? [DISCONTINUED] levoFLOXacin (Levaquin) 750 mg tablet Take 2 hours prior to biopsy 1 Tab 0   ??? [DISCONTINUED] rosuvastatin (CRESTOR) 10 mg tablet Take 1 Tab by mouth nightly. 90 Tab 1     No current facility-administered medications on file prior to visit.        No Known Allergies    PSA Flow:  Lab Results   Component Value Date/Time    Prostate Specific Ag 5.4 (H) 08/15/2019 11:25 AM    Prostate Specific Ag 5.330 (A) 06/13/2019    Prostate Specific Ag 2.3 05/01/2014 01:12 PM       Prebiopsy diagnosis:  Elevated PSA    Postbiopsy diagnosis:  Same    Procedure performed:  Hi-Res Transrectal [place] and Echo guided Prostate Needle Biopsy    Surgeon: Retia Passe, M.D., F.A.C.S.     Palpable Nodule: No    Informed consent: Yes. Reviewed again bleeding, infection, sepsis death and others. All questions answered and patient agrees.    Procedure:  Patient has confirmed that the oral antibiotic Levaquin was taken prior to the procedure and had a limited bowel prep. IM antibiotic was given by staff.  The patient verifies that he is not taking blood thinners or aspirin.  Urinalysis noted to be negative for infection.       He was placed in the left lateral decubitus position with knees drawn up.  An endorectal probe was placed in the rectum. Anesthesia: Prostate Block - 2% Xylocaine was injected in an appropriate fashion.    Transverse and sagittal images of the prostate and seminal vesicles were obtained and measurements were recorded.  Using the needle guide on the probe, a biopsy  needle was used to obtain biopsies of the prostate in a standard fashion.  If seen, any abnormal lesions were targeted along with systematic biopsies from each side.  Tissue cores were placed in formalin jars and sent for pathologic review.    The probe was removed and the patient was observed.  Appropriate activity levels and timing of resuming sexual activity was reviewed.  Patient was warned of potential complications and with instructions on how to contact our office. This includes but not limited to elevated temperature, excessive bleeding per urethra or rectum as well as others.  All questions were answered to his satisfaction.       Findings:    DRE-  Prostate gland examined and noted to be without prostate nodule  TRUS Volume:  15 cm3   PSA Density: 0.35  Seminal vesicles:  Normal and symmetrical    Biopsy Pattern with PRIMUS Score:    Base Mid  Apex   Left 1  1  1     Right 1  1 1     Lateral left 1  1  1     Lateral right 1  1  1         Plan:     ?? The patient will have curtailed activity today and drink a lot of  fluid. He will call with any problems or questions.   ?? The patient will be called within 2 weeks from now to go over the results of the biopsy       Retia Passe, M.D., F.A.C.S.    Documentation provided by Fortino Sic, medical scribe for Retia Passe, M.D., F.A.C.S.

## 2019-08-30 NOTE — Progress Notes (Signed)
Cameron Coleman is a 58 y.o. male who is here today per the order of Dr. Donovan Kail to receive Rocephin.   Patient's identity has been verified.   Dr. Donovan Kail was available in the clinic as incident to provider.     Rocephin is administered to gluteus ( right) IM without difficulty.       Patient tolerated the injection well.    Patient has been scheduled for the next injection which will be due in approximately 0 days    Encounter Diagnoses   Name Primary?   ??? Nocturia Yes   ??? Elevated PSA    ??? Elevated prostate specific antigen (PSA)        Orders Placed This Encounter   ??? PR ECHO,TRANSRECTAL   ??? PR SONO GUIDE NEEDLE BIOPSY   ??? AMB POC URINALYSIS DIP STICK AUTO W/O MICRO   ??? CEFTRIAXONE SODIUM INJECTION PER 250 MG     Mix per protocol   ??? PR THER/PROPH/DIAG INJECTION, SUBCUT/IM   ??? BIOPSY OF PROSTATE,NEEDLE/PUNCH   ??? ciprofloxacin HCl (CIPRO) 500 mg tablet     Sig: Take 1 Tab by mouth two (2) times a day.     Dispense:  2 Tab     Refill:  0     Order Specific Question:   Expiration Date     Answer:   08/12/2020     Order Specific Question:   Lot#     Answer:   [id number]     Order Specific Question:   Manufacturer     Answer:   [employer]     Order Specific Question:   NDC#     Answer:   [id number]   ??? cefTRIAXone (ROCEPHIN) 1 gram injection     Sig: 1 g by IntraMUSCular route once for 1 dose.     Dispense:  1 Vial     Refill:  0   ??? SURGICAL PATHOLOGY     Order Specific Question:   Type of Specimen and Locations?     Answer:   prostate     Order Specific Question:   Patient's clinical history:     Answer:   elevated psa, family hx of prostate cancer       Patient did not supply own medication.     Yetta Numbers, CCMA

## 2019-08-30 NOTE — Progress Notes (Signed)
Cameron Coleman is a 58 y.o. male who is here today per the order of Dr. Lawson Fiscal to receive Rocephin.   Patient's identity has been verified.   Dr. Lawson Fiscal was available in the clinic as incident to provider.     Rocephin is administered to gluteus ( right) IM without difficulty.       Patient tolerated the injection well.    Patient has been scheduled for the next injection which will be due in approximately 0 days    Encounter Diagnoses   Name Primary?   . Nocturia Yes   . Elevated PSA    . Elevated prostate specific antigen (PSA)        Orders Placed This Encounter   . PR ECHO,TRANSRECTAL   . PR SONO GUIDE NEEDLE BIOPSY   . AMB POC URINALYSIS DIP STICK AUTO W/O MICRO   . CEFTRIAXONE SODIUM INJECTION PER 250 MG     Mix per protocol   . PR THER/PROPH/DIAG INJECTION, SUBCUT/IM   . BIOPSY OF PROSTATE,NEEDLE/PUNCH   . ciprofloxacin HCl (CIPRO) 500 mg tablet     Sig: Take 1 Tab by mouth two (2) times a day.     Dispense:  2 Tab     Refill:  0     Order Specific Question:   Expiration Date     Answer:   08/12/2020     Order Specific Question:   Lot#     Answer:   [id number]     Order Specific Question:   Manufacturer     Answer:   [employer]     Order Specific Question:   NDC#     Answer:   [id number]   . cefTRIAXone (ROCEPHIN) 1 gram injection     Sig: 1 g by IntraMUSCular route once for 1 dose.     Dispense:  1 Vial     Refill:  0   . SURGICAL PATHOLOGY     Order Specific Question:   Type of Specimen and Locations?     Answer:   prostate     Order Specific Question:   Patient's clinical history:     Answer:   elevated psa, family hx of prostate cancer       Patient did not supply own medication.     Derenda Fennel, CCMA

## 2019-08-30 NOTE — Progress Notes (Signed)
Progress Notes by Retia Passe, MD at 08/30/19 1045                Author: Retia Passe, MD  Service: --  Author Type: Physician       Filed: 08/30/19 1204  Encounter Date: 08/30/2019  Status: Signed          Editor: Retia Passe, MD (Physician)                       HI-RES TRANSRECTAL ULTRASOUND AND PROSTATE NEEDLE BIOPSY NOTE           Current Outpatient Medications on File Prior to Visit          Medication  Sig  Dispense  Refill           ?  furosemide (LASIX) 40 mg tablet  Take 40 mg by mouth daily as needed.         ?  irbesartan (AVAPRO) 75 mg tablet  Take 1 Tab by mouth nightly.  90 Tab  1     ?  [DISCONTINUED] clotrimazole (LOTRIMIN) 1 % topical cream  Apply  to affected area two (2) times a day.         ?  [DISCONTINUED] levoFLOXacin (Levaquin) 750 mg tablet  Take 2 hours prior to biopsy  1 Tab  0           ?  [DISCONTINUED] rosuvastatin (CRESTOR) 10 mg tablet  Take 1 Tab by mouth nightly.  90 Tab  1          No current facility-administered medications on file prior to visit.            No Known Allergies      PSA Flow:     Lab Results         Component  Value  Date/Time            Prostate Specific Ag  5.4 (H)  08/15/2019 11:25 AM       Prostate Specific Ag  5.330 (A)  06/13/2019            Prostate Specific Ag  2.3  05/01/2014 01:12 PM           Prebiopsy diagnosis:  Elevated PSA      Postbiopsy diagnosis:  Same      Procedure performed:  Hi-Res Transrectal [place] and Echo guided Prostate Needle Biopsy      Surgeon: Retia Passe, M.D., F.A.C.S.       Palpable Nodule: No      Informed consent: Yes. Reviewed again bleeding, infection, sepsis death and others. All questions answered and patient agrees.      Procedure:   Patient has confirmed that the oral antibiotic Levaquin was taken prior to the procedure and had a limited bowel prep. IM antibiotic was given by staff.  The patient verifies that he is not taking blood  thinners or aspirin.  Urinalysis noted to be negative for  infection.         He was placed in the left lateral decubitus position with knees drawn up.  An endorectal probe was placed in the rectum. Anesthesia: Prostate Block - 2% Xylocaine was injected in an appropriate fashion.    Transverse and sagittal images of the prostate  and seminal vesicles were obtained and measurements were recorded.  Using the needle guide on the probe, a biopsy needle was used to obtain biopsies of the  prostate in a standard fashion.  If seen, any abnormal lesions were targeted along with systematic  biopsies from each side.  Tissue cores were placed in formalin jars and sent for pathologic review.      The probe was removed and the patient was observed.  Appropriate activity levels and timing of resuming sexual activity was reviewed.  Patient was warned of potential complications and with instructions on how to contact our office. This includes but  not limited to elevated temperature, excessive bleeding per urethra or rectum as well as others.  All questions were answered to his satisfaction.         Findings:     DRE-  Prostate gland examined and noted to be without prostate nodule   TRUS Volume:  15 cm3    PSA Density: 0.35   Seminal vesicles:  Normal and symmetrical      Biopsy Pattern with PRIMUS Score:           Base  Mid   Apex          Left  1   1   1            Right  1   1  1            Lateral left  1   1   1            Lateral right  1   1   1             Plan:      ??  The patient will have curtailed activity today and drink a lot of fluid. He will call with any problems or questions.    ??  The patient will be called within 2 weeks from now to go over the results of the biopsy          Retia Passe, M.D., F.A.C.S.      Documentation provided by Fortino Sic, medical scribe for Retia Passe, M.D., F.A.C.S.

## 2019-09-01 ENCOUNTER — Emergency Department (HOSPITAL_COMMUNITY): Payer: BC Managed Care – PPO

## 2019-09-01 ENCOUNTER — Encounter (HOSPITAL_COMMUNITY): Payer: Self-pay | Admitting: Emergency Medicine

## 2019-09-01 ENCOUNTER — Emergency Department (HOSPITAL_COMMUNITY)
Admission: EM | Admit: 2019-09-01 | Discharge: 2019-09-01 | Disposition: A | Discharge status: Home / Self Care | Payer: BC Managed Care – PPO | Attending: Emergency Medicine | Admitting: Emergency Medicine

## 2019-09-01 DIAGNOSIS — S0990XA Unspecified injury of head, initial encounter: Secondary | ICD-10-CM | POA: Diagnosis present

## 2019-09-01 DIAGNOSIS — Y999 Unspecified external cause status: Secondary | ICD-10-CM | POA: Diagnosis not present

## 2019-09-01 DIAGNOSIS — S0231XA Fracture of orbital floor, right side, initial encounter for closed fracture: Secondary | ICD-10-CM | POA: Diagnosis not present

## 2019-09-01 DIAGNOSIS — Y92410 Unspecified street and highway as the place of occurrence of the external cause: Secondary | ICD-10-CM | POA: Diagnosis not present

## 2019-09-01 DIAGNOSIS — Y93I9 Activity, other involving external motion: Secondary | ICD-10-CM | POA: Diagnosis not present

## 2019-09-01 DIAGNOSIS — I10 Essential (primary) hypertension: Secondary | ICD-10-CM | POA: Diagnosis not present

## 2019-09-01 HISTORY — DX: Essential (primary) hypertension: I10

## 2019-09-01 LAB — COMPREHENSIVE METABOLIC PANEL
ALT: 28 U/L (ref 0–44)
AST: 30 U/L (ref 15–41)
Albumin: 4.1 g/dL (ref 3.5–5.0)
Alkaline Phosphatase: 81 U/L (ref 38–126)
Anion gap: 9 (ref 5–15)
BUN: 17 mg/dL (ref 6–20)
CO2: 25 mmol/L (ref 22–32)
Calcium: 9.1 mg/dL (ref 8.9–10.3)
Chloride: 106 mmol/L (ref 98–111)
Creatinine, Ser: 1.28 mg/dL — ABNORMAL HIGH (ref 0.61–1.24)
GFR calc Af Amer: >60 mL/min (ref >60)
GFR calc non Af Amer: >60 mL/min (ref >60)
Glucose, Bld: 117 mg/dL — ABNORMAL HIGH (ref 70–99)
Potassium: 3.9 mmol/L (ref 3.5–5.1)
Sodium: 140 mmol/L (ref 135–145)
Total Bilirubin: 0.5 mg/dL (ref 0.3–1.2)
Total Protein: 6.5 g/dL (ref 6.5–8.1)

## 2019-09-01 LAB — CBC
HCT: 45.1 % (ref 39.0–52.0)
Hemoglobin: 14.7 g/dL (ref 13.0–17.0)
MCH: 28.4 pg (ref 26.0–34.0)
MCHC: 32.6 g/dL (ref 30.0–36.0)
MCV: 87.2 fL (ref 80.0–100.0)
Platelets: 215 10*3/uL (ref 150–400)
RBC: 5.17 MIL/uL (ref 4.22–5.81)
RDW: 13.5 % (ref 11.5–15.5)
WBC: 8.4 10*3/uL (ref 4.0–10.5)
nRBC: 0 % (ref 0.0–0.2)

## 2019-09-01 MED ORDER — AMOXICILLIN-POT CLAVULANATE 875-125 MG PO TABS: 1.0000 | ORAL_TABLET | Freq: Two times a day (BID) | ORAL | 0 refills | Status: AC

## 2019-09-01 MED ORDER — DEXAMETHASONE SODIUM PHOSPHATE 10 MG/ML IJ SOLN
10.0000 mg | Freq: Once | INTRAMUSCULAR | Status: AC
  Administered 2019-09-01: 15:00:00 10 mg via INTRAVENOUS
  Filled 2019-09-01: qty 1

## 2019-09-01 MED ORDER — OXYCODONE HCL 5 MG PO TABS: 5.0000 mg | ORAL_TABLET | Freq: Once | ORAL | Status: DC

## 2019-09-01 MED ORDER — ONDANSETRON HCL 4 MG/2ML IJ SOLN
4.0000 mg | Freq: Once | INTRAMUSCULAR | Status: AC
  Administered 2019-09-01: 4 mg via INTRAVENOUS

## 2019-09-01 NOTE — Discharge Instructions (Addendum)
You were evaluated in the Emergency Department and after careful evaluation, we did not find any emergent condition requiring admission or further testing in the hospital.  Your exam/testing today is overall reassuring.  As discussed, please take the antibiotics as directed and avoid any blowing of the nose and do not hold and sneezes.  We discussed your case with Dr. Mancel Parsons of oral and maxillofacial surgery.  Please call [phone] to schedule an appointment at the oral surgery center located on [address]  here in [place].  Please return to the Emergency Department if you experience any worsening of your condition.  We encourage you to follow up with a primary care provider.  Thank you for allowing [place] to be a part of your care.
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
## 2019-09-01 NOTE — ED Notes (Signed)
Patient transported to CT 
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
## 2019-09-01 NOTE — ED Notes (Signed)
Family updated as to patient's status.

## 2019-09-01 NOTE — ED Notes (Signed)
Attempted to contact pt's mother Math Brazie [phone]); no answer

## 2019-09-01 NOTE — ED Notes (Signed)
Patient verbalizes understanding of discharge instructions. Opportunity for questioning and answers were provided. Pt discharged from ED. 

## 2019-09-13 ENCOUNTER — Ambulatory Visit
Admit: 2019-09-13 | Discharge: 2019-09-13 | Payer: PRIVATE HEALTH INSURANCE | Attending: Urology | Primary: Family Medicine

## 2019-09-13 ENCOUNTER — Ambulatory Visit: Attending: Urology | Primary: Family Medicine

## 2019-09-13 DIAGNOSIS — C61 Malignant neoplasm of prostate: Secondary | ICD-10-CM

## 2019-09-13 LAB — AMB POC URINALYSIS DIP STICK AUTO W/O MICRO
Bilirubin (UA POC): NEGATIVE
Bilirubin, Urine, POC: NEGATIVE
Glucose (UA POC): NEGATIVE
Glucose, Urine, POC: NEGATIVE
Leukocyte Esterase, Urine, POC: NEGATIVE
Leukocyte esterase (UA POC): NEGATIVE
Nitrite, Urine, POC: NEGATIVE
Nitrites (UA POC): NEGATIVE
Specific Gravity, Urine, POC: 1.02 NA (ref 1.001–1.035)
Specific gravity (UA POC): 1.02 (ref 1.001–1.035)
Urobilinogen (UA POC): 1 (ref 0.2–1)
Urobilinogen, POC: 1 (ref 0.2–1)
pH (UA POC): 7 (ref 4.6–8.0)
pH, Urine, POC: 7 NA (ref 4.6–8.0)

## 2019-09-13 NOTE — Progress Notes (Signed)
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 Prostate cancer ([place])  C61 185   2. Nocturia  R35.1 788.43          ASSESSMENT & PLAN:   1. Newly diagnosed prostate cancer, Gleason 6 in 2/12 cores   Reviewed pathology with patient and wife- copy of report provided    Reviewed options including active surveillance, radiation, surgery and cryoablation. Risks and benefits reviewed    Recommend Active Surveillance.    Will do Oncotype Dx- will call with result    Will repeat PSA in 5 months    Recommended Prostate 2.4, a multi-component nutritional supplement designed to support and maintain a healthy prostate.  The patient was given information on how to order.       2. Hx ED- seen in 2017 by Dr. Marthann Schiller. Takes OTC supplement with good response        Follow-up and Dispositions    ?? Return in about 6 months (around 03/13/2020) for PSA F&T 1 month prior .         Chief Complaint   Patient presents with   ??? Elevated PSA   ??? Nocturia        HPI: Cameron Coleman is a 58 y.o. BLACK OR [place] male  who presents today to review pathology result. Patient is s/p TRUS biopsy on 08/30/19, PSA at the time was 5.4 ng/mL and TRUS Volume: 15 cm3. Pathology revealed Gleason 3+3=6 in 2/12 cores. Report outlined below. Positive family h/o prostate cancer in his father (diagnosed at age 59s).  Patient is doing well today.       LABS / IMAGING:  Lab Results   Component Value Date/Time    Prostate Specific Ag 5.4 (H) 08/15/2019 11:25 AM    Prostate Specific Ag 5.330 (A) 06/13/2019    Prostate Specific Ag 2.3 05/01/2014 01:12 PM     Pathology 08/30/19      AUA Assessment Score:   ;    AUA Bother Rating:      Current Outpatient Medications    Medication Sig Dispense Refill   ??? ciprofloxacin HCl (CIPRO) 500 mg tablet Take 1 Tab by mouth two (2) times a day. 2 Tab 0   ??? furosemide (LASIX) 40 mg tablet Take 40 mg by mouth daily as needed.     ??? irbesartan (AVAPRO) 75 mg tablet Take 1 Tab by mouth nightly. 90 Tab 1       All:  No Known Allergies    Past Medical History:   Diagnosis Date   ??? Arthritis     arthritis and left knee status post drainage x2   ??? Decreased libido    ??? Erectile dysfunction    ??? HTN (hypertension)    ??? Prostate cancer ([place]) 09/04/2019       Past Surgical History:   Procedure Laterality Date   ??? HX UROLOGICAL  08/30/2019    PNBx, Gleason 6, Dr Edison Pace, [facility]       Review of Systems  Constitutional: Fever:   Skin: Rash:   HEENT: Hearing difficulty:  Eyes: Blurred vision:   Cardiovascular: Chest pain:   Respiratory: Shortness of breath:   Gastrointestinal: Nausea/vomiting:   Musculoskeletal: Back pain:   Neurological: Weakness:   Psychological: Memory loss:   Comments/additional findings:     Any elements of the PMFSHx, ROS, or preliminary elements of the HPI that were entered by a medical assistant have been reviewed in full.    PHYSICAL EXAM:    Visit Vitals  Ht 6\' 2"  (1.88 m)   Wt 277 lb (125.6 kg)   BMI 35.56 kg/m??     Constitutional: WDWN, Pleasant and appropriate affect, No acute distress.    CV:  No peripheral swelling noted  Respiratory: No respiratory distress or difficulties  Skin: No jaundice.    Neuro/Psych:  Alert and oriented x 3. Affect appropriate.       Body mass index is 35.56 kg/m??. Patient's BMI is out of the normal parameters.  Information about BMI was given and patient was advised to follow-up with their PCP for further management.     Labs Today:  Results for orders placed or performed in visit on 09/13/19   AMB POC URINALYSIS DIP STICK AUTO W/O MICRO   Result Value Ref Range    Color (UA POC) Yellow     Clarity (UA POC) Clear     Glucose (UA POC) Negative Negative    Bilirubin (UA POC) Negative Negative     Ketones (UA POC) Trace Negative    Specific gravity (UA POC) 1.020 1.001 - 1.035    Blood (UA POC) 3+ Negative    pH (UA POC) 7.0 4.6 - 8.0    Protein (UA POC) Trace Negative    Urobilinogen (UA POC) 1 mg/dL 0.2 - 1    Nitrites (UA POC) Negative Negative    Leukocyte esterase (UA POC) Negative Negative        Retia Passe, M.D., F.A.C.S.    Documentation provided by Fortino Sic, medical scribe for Retia Passe, M.D., F.A.C.S.

## 2019-09-13 NOTE — Progress Notes (Signed)
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  Prostate cancer ([place])   C61  185          2.  Nocturia   R35.1  788.43               ASSESSMENT & PLAN:    1. Newly diagnosed prostate cancer, Gleason 6 in 2/12 cores    Reviewed pathology with patient and wife- copy of report provided     Reviewed options including active surveillance, radiation, surgery and cryoablation. Risks and benefits reviewed     Recommend Active Surveillance.     Will do Oncotype Dx- will call with result     Will repeat PSA in 5 months     Recommended Prostate 2.4, a multi-component nutritional supplement designed to support and maintain a healthy prostate.  The patient was given information on how to order.          2. Hx ED- seen in 2017 by Dr. Marthann Schiller. Takes OTC supplement with good response             Follow-up and Dispositions      ??  Return in about 6 months (around 03/13/2020) for PSA F&T 1 month prior .                  Chief Complaint       Patient presents with        ?  Elevated PSA        ?  Nocturia            HPI: Cameron Coleman is a  58 y.o. BLACK OR AFR[place] AMER[place]  male  who presents today to review pathology result. Patient is s/p TRUS biopsy on 08/30/19, PSA at the time was 5.4 ng/mL and TRUS  Volume: 15 cm3. Pathology revealed Gleason 3+3=6 in 2/12 cores. Report outlined below. Positive family h/o prostate cancer in his  father (diagnosed at age 43s).   Patient is doing well today.          LABS / IMAGING:     Lab Results         Component  Value  Date/Time            Prostate Specific Ag  5.4 (H)  08/15/2019 11:25 AM       Prostate Specific Ag  5.330 (A)  06/13/2019  Prostate Specific Ag  2.3  05/01/2014 01:12 PM        Pathology 08/30/19           AUA Assessment Score:    ;     AUA Bother Rating:          Current Outpatient Medications          Medication  Sig  Dispense  Refill           ?  ciprofloxacin HCl (CIPRO) 500 mg tablet  Take 1 Tab by mouth two (2) times a day.  2 Tab  0     ?  furosemide (LASIX) 40 mg tablet  Take 40 mg by mouth daily as needed.               ?  irbesartan (AVAPRO) 75 mg tablet  Take 1 Tab by mouth nightly.  90 Tab  1           All:   No Known Allergies        Past Medical History:        Diagnosis  Date         ?  Arthritis            arthritis and left knee status post drainage x2         ?  Decreased libido       ?  Erectile dysfunction       ?  HTN (hypertension)           ?  Prostate cancer ([place])  09/04/2019             Past Surgical History:         Procedure  Laterality  Date          ?  HX UROLOGICAL    08/30/2019          PNBx, Gleason 6, Dr Edison Pace, [facility]           Review of Systems   Constitutional: Fever:    Skin: Rash:    HEENT: Hearing difficulty:    Eyes: Blurred vision:    Cardiovascular: Chest pain:    Respiratory: Shortness of breath:    Gastrointestinal: Nausea/vomiting:    Musculoskeletal: Back pain:    Neurological: Weakness:    Psychological: Memory loss:    Comments/additional findings:       Any elements of the PMFSHx, ROS, or preliminary elements of the HPI that were entered by a medical assistant have been reviewed in full.      PHYSICAL EXAM:     Visit Vitals      Ht  6\' 2"  (1.88 m)     Wt  277 lb (125.6 kg)        BMI  35.56 kg/m??        Constitutional: WDWN, Pleasant and appropriate affect, No acute distress.     CV:  No peripheral swelling noted   Respiratory:  No respiratory distress or difficulties   Skin: No jaundice.     Neuro/Psych:  Alert and oriented x 3. Affect appropriate.          Body mass index is 35.56 kg/m??. Patient's BMI is out of the normal parameters.  Information about BMI was given and patient was advised to  follow-up with their PCP for further management.       Labs Today:     Results for orders placed or performed in visit on 09/13/19  AMB POC URINALYSIS DIP STICK AUTO W/O MICRO         Result  Value  Ref Range            Color (UA POC)  Yellow         Clarity (UA POC)  Clear         Glucose (UA POC)  Negative  Negative       Bilirubin (UA POC)  Negative  Negative       Ketones (UA POC)  Trace  Negative       Specific gravity (UA POC)  1.020  1.001 - 1.035       Blood (UA POC)  3+  Negative       pH (UA POC)  7.0  4.6 - 8.0       Protein (UA POC)  Trace  Negative       Urobilinogen (UA POC)  1 mg/dL  0.2 - 1       Nitrites (UA POC)  Negative  Negative            Leukocyte esterase (UA POC)  Negative  Negative            Retia Passe, M.D., F.A.C.S.      Documentation provided by Fortino Sic, medical scribe for Retia Passe, M.D., F.A.C.S.

## 2019-10-08 NOTE — Progress Notes (Signed)
due to see you in a  month  oncotype ordered and  result in chart

## 2019-11-20 NOTE — Telephone Encounter (Signed)
Attempted to contact patient to review biopsy prep instructions. Left message for patient to return my call at ext [phone].  Antinita R Jenkins

## 2019-11-27 ENCOUNTER — Encounter: Payer: PRIVATE HEALTH INSURANCE | Attending: Urology | Primary: Family Medicine

## 2020-02-21 ENCOUNTER — Encounter: Payer: PRIVATE HEALTH INSURANCE | Primary: Family Medicine

## 2020-02-21 ENCOUNTER — Institutional Professional Consult (permissible substitution): Admit: 2020-02-21 | Discharge: 2020-02-21 | Primary: Family Medicine

## 2020-02-21 DIAGNOSIS — C61 Malignant neoplasm of prostate: Secondary | ICD-10-CM

## 2020-02-21 NOTE — Progress Notes (Signed)
Deano Parello presents today for lab draw per Dr. Leanord Asal order.   Dr. Reinaldo Raddle was present in the clinic as incident to.     PSA (F/T) obtained via venipuncture without any difficulty.    Patient will be notified with lab results.       Orders Placed This Encounter   ??? PSA (TOTAL, REFLEX TO FREE)   ??? COLLECTION VENOUS [place]

## 2020-02-21 NOTE — Progress Notes (Signed)
Cameron Coleman presents today for lab draw per Dr. Lerry Paterson order.   Dr. Cleda Mccreedy was present in the clinic as incident to.     PSA (F/T) obtained via venipuncture without any difficulty.    Patient will be notified with lab results.       Orders Placed This Encounter   . PSA (TOTAL, REFLEX TO FREE)   . COLLECTION VENOUS BLOOD,VENIPUNCTURE       Kennith Maes

## 2020-02-22 LAB — PSA TOTAL+% FREE
PSA, % Free: 6.4 %
PSA, Free Pct: 6.4 %
PSA, Free: 0.34 ng/mL
PSA, Free: 0.34 ng/mL
PSA: 5.3 ng/mL — ABNORMAL HIGH (ref 0.0–4.0)
Prostate Specific Ag: 5.3 ng/mL — ABNORMAL HIGH (ref 0.0–4.0)

## 2020-03-19 ENCOUNTER — Encounter: Payer: PRIVATE HEALTH INSURANCE | Attending: Urology | Primary: Family Medicine

## 2020-03-19 ENCOUNTER — Encounter: Attending: Urology | Primary: Family Medicine

## 2020-09-17 LAB — HEMOGLOBIN A1C
Estimated Avg Glucose, External: 137 mg/dL — ABNORMAL HIGH (ref 91–123)
Hemoglobin A1C, External: 6.4 % — ABNORMAL HIGH (ref 4.8–5.6)
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
## 2021-03-19 LAB — HEMOGLOBIN A1C
Estimated Avg Glucose, External: 122 mg/dL (ref 91–123)
Hemoglobin A1C, External: 5.9 % — ABNORMAL HIGH (ref 4.8–5.6)

## 2021-04-02 ENCOUNTER — Encounter: Admit: 2021-04-02 | Discharge: 2021-04-02 | Attending: Nurse Practitioner | Primary: Family Medicine

## 2021-04-02 ENCOUNTER — Ambulatory Visit: Attending: Nurse Practitioner | Primary: Internal Medicine

## 2021-04-02 DIAGNOSIS — C61 Malignant neoplasm of prostate: Secondary | ICD-10-CM

## 2021-04-02 LAB — AMB POC URINALYSIS DIP STICK AUTO W/O MICRO
Bilirubin (UA POC): NEGATIVE
Bilirubin, Urine, POC: NEGATIVE
Blood (UA POC): NEGATIVE
Blood (UA POC): NEGATIVE
Glucose (UA POC): NEGATIVE
Glucose, Urine, POC: NEGATIVE
Leukocyte Esterase, Urine, POC: NEGATIVE
Leukocyte esterase (UA POC): NEGATIVE
Nitrite, Urine, POC: NEGATIVE
Nitrites (UA POC): NEGATIVE
Protein (UA POC): NEGATIVE
Protein, Urine, POC: NEGATIVE
Specific Gravity, Urine, POC: 1.025 NA (ref 1.001–1.035)
Specific gravity (UA POC): 1.025 (ref 1.001–1.035)
Urobilinogen (UA POC): 1 (ref 0.2–1)
Urobilinogen, POC: 1 (ref 0.2–1)
pH (UA POC): 6.5 (ref 4.6–8.0)
pH, Urine, POC: 6.5 NA (ref 4.6–8.0)

## 2021-04-02 NOTE — Progress Notes (Signed)
Progress Notes by Cameron Latch, NP at 04/02/21 1320                Author: Skeet Latch, NP  Service: --  Author Type: Nurse Practitioner       Filed: 04/02/21 1402  Encounter Date: 04/02/2021  Status: Signed          Editor: Cameron Latch, NP (Nurse Practitioner)               Cameron Coleman   13-Jun-1961   Encounter date: 04/02/2021              Follow-up Visit              Encounter Diagnoses              ICD-10-CM  ICD-9-CM          1.  Prostate cancer ([place])   C61  185     2.  Elevated PSA   R97.20  790.93     3.  Family history of prostate cancer in father   Z4.42  V33.42          4.  Erectile dysfunction, unspecified erectile dysfunction type   N52.9  607.84               ASSESSMENT & PLAN:    1. 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  ED-     seen in 2017 by Dr. Marthann Schiller. Takes OTC supplement with good response       Family hx of CaP    Father dx in 50s          UA reviewed today: unremarkable   Reviewed most recent PSA: 8.120 on 03/18/2021   Discussed options for elevated PSA   DRE performed today- only able to palpate apex   Recommend MRI of the prostate   Pending MRI results proceed with biopsy   FU in 4 weeks to review MRI results, sooner if needed        Follow-up and Dispositions      ??  Return in about 4 weeks (around 04/30/2021) for FU with 4 weeks.          DISCUSSION:   I explained that a PSA test stands for "prostate specific antigen" but not prostate cancer specific antigen.  Elevation of the PSA may be caused by lab error, prostatitis, BPH, or prostate cancer.   Comparing many PSA test results over time can be helpful.  A series of rapidly rising PSA values can suggest  possible cancer.  A very slow rise in PSA over many years may represent benign growth of the prostate.  Drugs such as Finasteride and Dutasteride  lower PSA values.  Stopping these same drugs will lead to a slow return of PSA to the original values.      I discussed options for evaluating a high PSA value:  Recheck the PSA and digital rectal exam at a later date and see if it changes.  This  carries risk of delaying a diagnosis of possible prostate cancer.   Check both a free and total PSA - the percent free PSA, if abnormal, may be an indication to proceed with a prostate biopsy.   Run additional blood and/or urine tests that look at isomers of PSA or the RNA content - again if these tests predict a likelihood of high grade cancer, this could be an indication for biopsy.  These tests may or may not be covered by insurance.  They  are not always reliable for making decisions to observe or proceed with biopsy.   Do a prostatic MRI - this test may be meaningful if there are positive findings, but cancers may exist even if the MRI is negative. Insurance has not been covering prostate MRI in biopsy naive patients.   Do a Transrectal Ultrasound and Biopsy (most aggressive approach with highest risks, but the most reliable method to find prostate cancer).           Chief Complaint       Patient presents with        ?  Follow-up             psa 8.1            HPI: Cameron Coleman is a  60 y.o. BLACK/[place] AMER[place]  male  who presents today for elevated PSA. He is accompanied by his wife today.   Gleason 3+3 prostate cancer, dx in 08/30/2019. He has been lost to follow since 2020.   He is on AS. Has been having his PCP monitor.   Most recent PSA 8.1 on 03/18/2021.      He has been doing well in the interim. Has no GU concerns/complaints. Is not currently on GU medications.      Denies flank pain, abdominal pain, dysuria, blood in urine, f/c/n/v.         Previous visit note on 09/13/2019 with Dr. Donovan Kail:    review pathology  result. Patient is s/p TRUS biopsy on 08/30/19, PSA at the time was 5.4 ng/mL and TRUS Volume: 15 cm3. Pathology revealed Gleason 3+3=6 in 2/12 cores. Report outlined  below. Positive family h/o prostate cancer in his father (diagnosed at age 58).   Patient is doing well today.          LABS / IMAGING:     Lab Results         Component  Value  Date/Time            Prostate Specific Ag  5.3 (H)  02/21/2020 10:07 AM       Prostate Specific Ag  5.4 (H)  08/15/2019 11:25 AM       Prostate Specific Ag  5.330 (A)  06/13/2019 12:00 AM            Prostate Specific Ag  2.3  05/01/2014 01:12 PM        Pathology 08/30/19           AUA Assessment Score:    ;     AUA Bother Rating:          Current Outpatient Medications          Medication  Sig  Dispense  Refill           ?  furosemide (LASIX) 40 mg tablet  Take 40 mg by mouth daily as needed.               ?  irbesartan (AVAPRO) 75  mg tablet  Take 1 Tab by mouth nightly.  90 Tab  1           All:   No Known Allergies        Past Medical History:        Diagnosis  Date         ?  Arthritis            arthritis and left knee status post drainage x2         ?  Decreased libido       ?  Erectile dysfunction       ?  HTN (hypertension)           ?  Prostate cancer ([place])  09/04/2019             Past Surgical History:         Procedure  Laterality  Date          ?  HX UROLOGICAL    08/30/2019          PNBx, Gleason 6, Dr Edison Pace, [facility]           Review of Systems   Constitutional: Fever: No   Skin: Rash: No   HEENT: Hearing difficulty: No   Eyes: Blurred vision: No   Cardiovascular: Chest pain: No   Respiratory: Shortness of breath: No   Gastrointestinal: Nausea/vomiting: No   Musculoskeletal: Back pain: No   Neurological: Weakness: No   Psychological: Memory loss: No   Comments/additional findings:       Any elements of the PMFSHx, ROS, or preliminary elements of the HPI that were entered by a medical assistant have been reviewed in full.      PHYSICAL EXAM:     Visit Vitals       Ht  6\' 2"  (1.88 m)     Wt  277 lb (125.6 kg)        BMI  35.56 kg/m??        Constitutional: WDWN, Pleasant and appropriate affect, No acute distress.     CV:  No peripheral swelling noted   Respiratory: No respiratory distress or difficulties   Skin: No jaundice.     Neuro/Psych:  Alert and oriented x 3. Affect appropriate.    GU MALE 04/02/2021: DRE only able to palpate the apex of the prostate         Body mass index is 35.56 kg/m??. Patient's BMI is out of the normal parameters.  Information about BMI was given and patient was advised to  follow-up with their PCP for further management.       Labs Today:   No results found for any visits on 04/02/21.       Cameron Latch, NP-C   [facility] [place] [address]    [phone] (office)

## 2021-04-02 NOTE — Progress Notes (Signed)
Sch 04/22/2021 10:45 AM MR PROSTATE 90 min [facility] MRI [facility] MRI LG BORE (3T)

## 2021-04-02 NOTE — Progress Notes (Signed)
Pt is scheduled at [facility] even though the order [facility] ONLY. Emailed sent to [facility] at [facility] to be fixed.

## 2021-04-02 NOTE — Progress Notes (Signed)
Cameron Coleman has an order for MRI PELV W WO CONT        ALL MALE PATIENTS NEEDING AN MRI OF PELVIS OR PROSTATE, MUST BE SCHEDULED AT ONE OF THE FOLLOWING:    **MRI/CT (Preferred [facility])  **[facility] (Preferred [facility])  **[facility] (Preferred [place])  [facility]  [facility]  [facility]      To be done at [facility]    Needed by:  Now    Patient has a follow-up appointment:  Yes      If MRI, does patient have a pacemaker:   No    Order has been placed in connect care:  Yes    Is this a STAT order:  no      Glory Buff

## 2021-04-02 NOTE — Addendum Note (Signed)
Addendum Note  by Kerby Less at 04/02/21 1320                Author: Kerby Less  Service: --  Author Type: Medical Assistant       Filed: 04/02/21 1512  Encounter Date: 04/02/2021  Status: Signed          Editor: Kerby Less (Medical Assistant)          Addended by: Kerby Less on: 04/02/2021 03:12 PM    Modules accepted: Orders

## 2021-04-02 NOTE — Progress Notes (Signed)
Faxed imaging to [facility]; [facility] ONLY) [phone]; MRI PELV; NOW

## 2021-04-27 ENCOUNTER — Ambulatory Visit: Admit: 2021-04-27 | Discharge: 2021-04-27 | Attending: Nurse Practitioner | Primary: Internal Medicine

## 2021-04-27 ENCOUNTER — Ambulatory Visit: Attending: Nurse Practitioner | Primary: Internal Medicine

## 2021-04-27 DIAGNOSIS — C61 Malignant neoplasm of prostate: Secondary | ICD-10-CM

## 2021-04-27 MED ORDER — TADALAFIL 5 MG TABLET
5 mg | ORAL_TABLET | Freq: Every day | ORAL | 3 refills | Status: DC | PRN
Start: 2021-04-27 — End: 2021-10-09

## 2021-04-27 NOTE — Progress Notes (Signed)
Progress Notes by Skeet Latch, NP at 04/27/21 1300                Author: Skeet Latch, NP  Service: --  Author Type: Nurse Practitioner       Filed: 04/27/21 1334  Encounter Date: 04/27/2021  Status: Signed          Editor: Skeet Latch, NP (Nurse Practitioner)               [name]   10/12/1961   Encounter date: 04/27/2021              Follow-up Visit              Encounter Diagnoses              ICD-10-CM  ICD-9-CM          1.  Prostate cancer ([place])   C61  185     2.  Elevated PSA   R97.20  790.93     3.  Family history of prostate cancer in father   Z20.42  V45.42          4.  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 Marthann Schiller. Takes OTC supplement with good response       Family hx of CaP    Father dx in 74s          PLAN:   UA reviewed today: unremarkable   Reviewed MRI results- PIRADS 5   Recommend proceeding with MRI Fusion bx of the prostate   Surgery letter sent for MRI Fusion Bx of the prostate   Start Cialis 5mg  daily, Rx sent to pharmacy, discussed SE   FU for biopsy and post biopsy as directed      DISCUSSION:   I explained that a PSA test stands for "prostate specific antigen" but not prostate cancer specific antigen.  Elevation of the PSA may be caused by lab error, prostatitis, BPH, or prostate cancer.   Comparing many PSA test results over time can be helpful.  A series of rapidly rising PSA values can suggest possible cancer.  A very slow rise in PSA over many years may represent benign growth of  the prostate.  Drugs such as Finasteride and Dutasteride  lower PSA values.  Stopping these same drugs will lead to a slow return of PSA to the original values.      I discussed options for evaluating a high PSA value:  Recheck the PSA and digital rectal exam at a later date and see if it changes.  This carries risk of delaying a diagnosis of possible prostate cancer.   Check both a free and total PSA - the percent free PSA, if abnormal, may be  an indication to proceed with a prostate biopsy.   Run additional blood and/or urine tests that look at isomers of PSA or the RNA content - again if these tests predict a likelihood of high grade cancer, this could be an indication for biopsy.  These tests may or may not be covered by insurance.  They  are not always reliable for making decisions to observe or proceed with biopsy.   Do a prostatic MRI - this test may be meaningful if there are positive findings, but cancers may exist even if the MRI is negative. Insurance has not been covering prostate MRI in biopsy naive patients.   Do a Transrectal Ultrasound and Biopsy (most aggressive approach with highest risks, but the most reliable method to find prostate cancer).           Chief Complaint       Patient presents with        ?  Follow-up            HPI: Cameron Coleman is a  60 y.o. BLACK/AFRICAN AMERICAN  male  who presents today for elevated PSA and to review MRI results. His wife is on speakerphone on patient's cell phone.      Patient reports doing well. Has no GU concerns or complaints. Is taking Flomax with benefit.   Denies flank pain, abdominal pain, dysuria, blood in urine, f/c/n/v.               Previous visit note on 04/02/2021:   He is accompanied by his wife today.   Gleason 3+3 prostate cancer, dx in 08/30/2019. He has been lost to follow since 2020.   He is on AS. Has been having his PCP monitor.   Most recent PSA 8.1 on 03/18/2021.      He has been doing well in the interim. Has no GU concerns/complaints. Is  not currently on GU medications.      Denies flank pain, abdominal pain, dysuria, blood in urine, f/c/n/v.         Previous visit note on 09/13/2019 with Dr. Donovan Kail:    review pathology result. Patient is s/p TRUS biopsy on 08/30/19, PSA at the time was 5.4 ng/mL and TRUS Volume: 15 cm3. Pathology revealed Gleason 3+3=6 in 2/12 cores. Report outlined  below. Positive family h/o prostate cancer in his father (diagnosed at age 21s).   Patient is doing well today.          LABS / IMAGING:   MRI PELVIS 04/22/2021   1. Lesion 1 in the ventral transition zone at the base measures 1.9 cm with features concerning for extraprostatic extension - PI-RADS 5.    2. No evidence of lymphadenopathy or osseous metastatic disease in the visualized pelvis.           Lab Results         Component  Value  Date/Time            Prostate Specific Ag  5.3 (H)  02/21/2020 10:07 AM       Prostate Specific Ag  5.4 (H)  08/15/2019 11:25 AM       Prostate Specific Ag  5.330 (A)  06/13/2019 12:00 AM            Prostate Specific Ag  2.3  05/01/2014 01:12 PM        Pathology 08/30/19           AUA Assessment Score:    ;  AUA Bother Rating:          Current Outpatient Medications          Medication  Sig  Dispense  Refill           ?  tadalafiL (CIALIS) 5 mg tablet  Take 1 Tablet by mouth daily as needed for Erectile Dysfunction.  90 Tablet  3     ?  furosemide (LASIX) 40 mg tablet  Take 40 mg by mouth daily as needed.               ?  irbesartan (AVAPRO) 75 mg tablet  Take 1 Tab by mouth nightly.  90 Tab  1           All:   No Known Allergies        Past Medical History:        Diagnosis  Date         ?  Arthritis            arthritis and left knee status post drainage x2         ?  Decreased libido       ?  Erectile dysfunction       ?  HTN (hypertension)           ?  Prostate cancer ([place])  09/04/2019             Past Surgical History:         Procedure  Laterality  Date          ?  HX UROLOGICAL    08/30/2019          PNBx, Gleason 6, Dr Edison Pace, [facility]           Review of Systems   Constitutional: Fever: No   Skin: Rash: No   HEENT: Hearing difficulty: No   Eyes: Blurred vision: No   Cardiovascular: Chest pain: No   Respiratory: Shortness of breath: No   Gastrointestinal: Nausea/vomiting: No   Musculoskeletal: Back pain: No   Neurological: Weakness: No   Psychological: Memory loss: No   Comments/additional findings:       Any elements of the PMFSHx, ROS, or preliminary elements of the HPI that were entered by a medical assistant have been reviewed in full.      PHYSICAL EXAM:     Visit Vitals      Ht  6\' 2"  (1.88 m)     Wt  277 lb (125.6 kg)        BMI  35.56 kg/m??        Constitutional: WDWN, Pleasant and appropriate affect, No acute distress.     CV:  No peripheral swelling noted   Respiratory: No respiratory distress or difficulties   Skin: No jaundice.     Neuro/Psych:  Alert and oriented x 3. Affect appropriate.    GU MALE 04/02/2021: DRE only able to palpate the apex of the prostate         Body mass index is 35.56 kg/m??. Patient's BMI is out of the normal parameters.  Information about BMI was given and patient was advised to  follow-up with their PCP for further management.       Labs Today:   No results found for any visits on 04/27/21.       Skeet Latch, NP-C   [facility] [place] [address]  [phone] (office)

## 2021-04-28 NOTE — Telephone Encounter (Signed)
I called and spoke with the patient in regards to setting up the MRI Fusion Biopsy that [facility] ordered for him.     Scheduled the patient for 05/18/2021 with the 1410 start time and 1210 arrival time to our surgery center.       I told him that I would send him the procedure instructions through mail.

## 2021-05-06 MED ORDER — LEVOFLOXACIN 750 MG TAB
750 mg | ORAL_TABLET | Freq: Every day | ORAL | 0 refills | Status: DC
Start: 2021-05-06 — End: 2021-09-04

## 2021-05-06 NOTE — Telephone Encounter (Signed)
Called Levaquin in to [facility] for Pt's MRI Fusion Biopsy on 05/18/2021.

## 2021-05-18 NOTE — Progress Notes (Signed)
Progress Notes by Nicola Girt, NP at 05/18/21 [phone]                Author: Nicola Girt, NP  Service: --  Author Type: Nurse Practitioner       Filed: 05/18/21 1124  Encounter Date: 05/18/2021  Status: Signed          Editor: Nicola Girt, NP (Nurse Practitioner)                       MRI/US FUSION PROSTATE BIOPSY OPERATIVE NOTE   05/18/2021            Cameron Coleman    DOB: 1961-09-24       Procedure:    1) Prostate Biopsy (CPT 55700)   2) Ultrasonic guidance for needle placement (CPT [phone])   3) Ultrasound, transrectal (CPT [phone])      Surgeon:  Nicola Girt, NP         Anesthesia:  MAC sedation   EBL:  Negligible   Tubes and Drains: none   Specimen(s):  See below    Complications:  None      INDICATIONS FOR PROCEDURE: Cameron Coleman  Is a gentleman with elevated PSA who underwent MP-MRI of the prostate with findings noted below.  Based on this patient was offered MRI/US Fusion biopsy of the prostate and after discussion of the risks and benefits has elected to proceed. The patient verifies that he is not taking blood thinners or aspirin. UA/UCx noted to be negative for infection.       MRI FINDINGS:      1. Lesion 1 in the ventral transition zone at the base measures 1.9 cm with features concerning for extraprostatic extension - PI-RADS 5.   2. No  evidence of lymphadenopathy or osseous metastatic disease in the visualized pelvis.     Overall PI-RADS assessment category: 5          PROCEDURE: After being properly identified the patient was brought to the operating room, placed in the lateral decubitus position and MAC was initiated. Timeout was called. Antibiotics were confirmed to have been administered. Digital rectal exam was  performed, and no palpable nodules or other abnormalities were noted. The trans-rectal ultrasound probe was placed in the rectum. Transverse and sagittal images of the prostate and seminal vesicles were obtained and measurements were recorded. Ultrasound  volume  was noted as 28cc.      A video sweep of the prostate in transverse plane was performed from base to apex. This was used to create a 3D model of the prostate which was then fused with the MRI images and the region(s) of interest noted.       Using the needle guide on the probe, a biopsy needle was used to obtain biopsies of the region of interest and then a standard template biopsy of the prostate with specimens as noted below. Tissue cores were placed in formalin jars and sent for pathologic  review.       The probe was removed and the patient was examined and hemostasis was seen to be adequate. At this point he was awoken and transferred to the PACU.          Biopsy Pattern: (with number of cores)       Region of interest:       ROI 1: 6 Cores  Base   Mid    Apex   Left       _0 Lateral left     _1 Right              _2 Lateral right  _3 Plan:   Appropriate activity levels and timing of resuming sexual activity was reviewed.  Patient was warned of potential complications  and with instructions on how to contact our office. This includes but not limited to elevated temperature, excessive bleeding per urethra or rectum as well as others. All questions were answered to his satisfaction.       He will follow up in the office to review results of the biopsy.

## 2021-05-26 ENCOUNTER — Encounter: Attending: Urology | Primary: Internal Medicine

## 2021-06-02 ENCOUNTER — Ambulatory Visit: Admit: 2021-06-02 | Discharge: 2021-06-02 | Attending: Urology | Primary: Internal Medicine

## 2021-06-02 ENCOUNTER — Ambulatory Visit: Attending: Urology | Primary: Internal Medicine

## 2021-06-02 DIAGNOSIS — C61 Malignant neoplasm of prostate: Secondary | ICD-10-CM

## 2021-06-02 LAB — AMB POC URINALYSIS DIP STICK AUTO W/O MICRO
Bilirubin (UA POC): NEGATIVE
Bilirubin, Urine, POC: NEGATIVE
Glucose (UA POC): NEGATIVE
Glucose, Urine, POC: NEGATIVE
Ketones (UA POC): NEGATIVE
Ketones, Urine, POC: NEGATIVE
Leukocyte Esterase, Urine, POC: NEGATIVE
Leukocyte esterase (UA POC): NEGATIVE
Nitrite, Urine, POC: NEGATIVE
Nitrites (UA POC): NEGATIVE
Protein (UA POC): NEGATIVE
Protein, Urine, POC: NEGATIVE
Specific Gravity, Urine, POC: 1.03 NA (ref 1.001–1.035)
Specific gravity (UA POC): 1.03 (ref 1.001–1.035)
Urobilinogen (UA POC): 0.2 (ref 0.2–1)
Urobilinogen, POC: 0.2 (ref 0.2–1)
pH (UA POC): 6 (ref 4.6–8.0)
pH, Urine, POC: 6 NA (ref 4.6–8.0)

## 2021-06-02 NOTE — Progress Notes (Signed)
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       ED- seen in 2017 by Dr. Marthann Coleman. Takes OTC supplement with good response        PLAN:   Continue Cialis 5mg  daily      Referral to GU oncology partner to discuss prostate CA treatment options   -- Next week pt going to [place] next week x 3 months -- Return date 08/18/2021        Follow-up and Dispositions      ??  Return for referral to GU oncology.             CC: Prostate CA      HPI: Cameron Coleman is a  59 y.o. BLACK/AFR[place] AMERICAN  male here for f/u Prostate CA, to review recent surveillance prostate bx   Has h/o T1cNxMx GS 6 (3 + 3) Prostate AdenoCA in 2/12 cores, up to 22% of one core, diagnosed  08/30/19, presenting PSA 5.4       >>Pt doing well, no residual issues since the prostate bx except some hematospermia.    No pain and no voiding difficulty.      05/18/2021 s/p MRI-US fusion bx of the prostate --    Pathology:     Prostate CA 4+3=7 in Right lateral midgland   3+4=7 in Right lateral base and in ROI Left anterior midgland   3+3=6 in Right base, and in Left midgland   Reviewed above w/ pt -- wife listened via cellphone call today         From 04/27/2021 visit with Cameron Latch, NP:   Patient presents today for elevated PSA and to  review MRI results. His wife is on speakerphone on patient's cell phone.   Patient reports doing well. Has no GU concerns or complaints. Is taking Flomax with benefit.   Denies flank pain, abdominal pain, dysuria, blood in urine, f/c/n/v.      Previous visit note on 04/02/2021:   He is accompanied by his wife today.   Gleason 3+3 prostate cancer, dx in 08/30/2019. He has been lost to follow since 2020.   He is on AS. Has been having his PCP monitor.   Most recent PSA 8.1 on 03/18/2021.      He has been doing well in the interim. Has no GU concerns/complaints.  Is not currently on GU medications.      Denies flank pain, abdominal pain, dysuria, blood in urine, f/c/n/v.         Previous visit note on 09/13/2019 with Cameron Coleman:    review pathology result. Patient is s/p TRUS biopsy on 08/30/19, PSA at the time was 5.4 ng/mL and TRUS Volume: 15 cm3. Pathology revealed Gleason 3+3=6 in 2/12 cores. Report outlined  below. Positive family h/o prostate cancer in his father (diagnosed at age 61s).   Patient is doing well today.          LABS / IMAGING:   MRI PELVIS 04/22/2021   1. Lesion 1 in the ventral transition zone at the base measures 1.9 cm with features concerning for extraprostatic extension - PI-RADS 5.    2. No evidence of lymphadenopathy or osseous metastatic disease in the visualized pelvis.   3.  Prostate volume 28 mL.          PSA trend:    03/18/2021 PSA 8.12   09/16/2020 PSA 7.77     Lab Results         Component  Value  Date/Time            Prostate Specific Ag  5.3 (H)  02/21/2020 10:07 AM       Prostate Specific Ag  5.4 (H)  08/15/2019 11:25 AM       Prostate Specific Ag  5.330 (A)  06/13/2019 12:00 AM            Prostate Specific Ag  2.3  05/01/2014 01:12 PM        Pathology 08/30/19           PMH/PSH/SocHx/FamHx/ Meds & allergies reviewed.      Past Medical History:        Diagnosis  Date         ?  Arthritis            arthritis and left knee status post drainage x2         ?  Decreased libido       ?  Erectile  dysfunction       ?  HTN (hypertension)           ?  Prostate cancer ([place])  09/04/2019          Past Surgical History:         Procedure  Laterality  Date          ?  HX UROLOGICAL    08/30/2019          PNBx, Gleason 6, Dr Cameron Coleman, [facility]          ?  HX UROLOGICAL    05/18/2021          TRANSRECTAL ULTRASOUND AND BIOPSY OF PROSTATE URONAV; Cameron Coleman          Social History          Socioeconomic History         ?  Marital status:  MARRIED              Spouse name:  Not on file         ?  Number of children:  Not on file     ?  Years of education:  Not on file     ?  Highest education level:  Not on file       Occupational History        ?  Not on file       Tobacco Use         ?  Smoking status:  Current Some Day Smoker              Types:  Cigars         ?  Smokeless tobacco:  Never Used        ?  Tobacco comment: occ cigar smoker       Substance and Sexual Activity         ?  Alcohol use:  Yes             Comment: Socially.          ?  Drug use:  No     ?  Sexual activity:  Yes              Partners:  Female         Birth control/protection:  None        Other Topics  Concern         ?  Military Service  No     ?  Blood Transfusions  No     ?  Caffeine Concern  No     ?  Occupational Exposure  No     ?  Hobby Hazards  Yes             Comment: Theme park manager.          ?  Sleep Concern  No     ?  Stress Concern  No     ?  Weight Concern  No     ?  Special Diet  No     ?  Back Care  No     ?  Exercise  No     ?  Bike Helmet  Yes     ?  Seat Belt  Yes     ?  Self-Exams  Yes       Social History Narrative        ?  Not on file          Social Determinants of Health          Financial Resource Strain:         ?  Difficulty of Paying Living Expenses: Not on file       Food Insecurity:         ?  Worried About Running Out of Food in the Last Year: Not on file     ?  Ran Out of Food in the Last Year: Not on file       Transportation Needs:         ?  Lack of Transportation (Medical): Not on file     ?  Lack of  Transportation (Non-Medical): Not on file       Physical Activity:         ?  Days of Exercise per Week: Not on file     ?  Minutes of Exercise per Session: Not on file       Stress:         ?  Feeling of Stress : Not on file       Social Connections:         ?  Frequency of Communication with Friends and Family: Not on file     ?  Frequency of Social Gatherings with Friends and Family: Not on file     ?  Attends Religious Services: Not on file     ?  Active Member of Clubs or Organizations: Not on file     ?  Attends Archivist Meetings: Not on file     ?  Marital Status: Not on file       Intimate Partner Violence:         ?  Fear of Current or Ex-Partner: Not on file     ?  Emotionally Abused: Not on file     ?  Physically Abused: Not on file     ?  Sexually Abused: Not on file       Housing Stability:         ?  Unable to Pay for Housing in the Last Year: Not on file     ?  Number of Places Lived in the Last Year: Not on file        ?  Unstable Housing in the Last Year: Not on file          Family History         Problem  Relation  Age of Onset          ?  Cancer  Mother       ?  Diabetes  Father            ?  Prostate Cancer  Father          No Known Allergies     Current Outpatient Medications on File Prior to Visit          Medication  Sig  Dispense  Refill           ?  levoFLOXacin (LEVAQUIN) 750 mg tablet  Take 1 Tablet by mouth daily. Take 1 Tablet by mouth 2 hours prior to procedure.  1 Tablet  0     ?  tadalafiL (CIALIS) 5 mg tablet  Take 1 Tablet by mouth daily as needed for Erectile Dysfunction.  90 Tablet  3     ?  furosemide (LASIX) 40 mg tablet  Take 40 mg by mouth daily as needed.               ?  irbesartan (AVAPRO) 75 mg tablet  Take 1 Tab by mouth nightly.  90 Tab  1          No current facility-administered medications on file prior to visit.        Urinalysis:     Results for orders placed or performed in visit on 06/02/21     AMB POC URINALYSIS DIP STICK AUTO W/O MICRO          Result  Value  Ref Range            Color (UA POC)  Amber         Clarity (UA POC)  Clear         Glucose (UA POC)  Negative  Negative       Bilirubin (UA POC)  Negative  Negative       Ketones (UA POC)  Negative  Negative       Specific gravity (UA POC)  1.030  1.001 - 1.035       Blood (UA POC)  1+  Negative       pH (UA POC)  6.0  4.6 - 8.0       Protein (UA POC)  Negative  Negative       Urobilinogen (  UA POC)  0.2 mg/dL  0.2 - 1       Nitrites (UA POC)  Negative  Negative            Leukocyte esterase (UA POC)  Negative  Negative        PHYSICAL EXAM:     Visit Vitals      Ht  6\' 2"  (1.88 m)     Wt  270 lb (122.5 kg)        BMI  34.67 kg/m??        Constitutional: WDWN, appropriate affect, No acute distress.     Respiratory: No respiratory distress or difficulties   Skin: No jaundice.     Neuro/Psych:  Alert and oriented x 3. Affect appropriate.    GU MALE 04/02/2021: DRE only able to palpate the apex of the prostate      Review of Systems   Constitutional: Fever: No   Skin: Rash: No   HEENT: Hearing difficulty: No   Eyes: Blurred vision: No   Cardiovascular: Chest pain: No   Respiratory: Shortness of breath: No   Gastrointestinal: Nausea/vomiting: No   Musculoskeletal: Back pain: No   Neurological: Weakness: No   Psychological: Memory loss: No   Comments/additional findings:          Cameron Koller, MD      CC:   Marye Round, MD

## 2021-06-05 NOTE — Progress Notes (Signed)
06/05/2021 I called Cameron Coleman to get him scheduled since he was going out of town and returning in September patient is scheduled 09/04/2021 @ 08:45 am

## 2021-09-04 ENCOUNTER — Ambulatory Visit
Admit: 2021-09-04 | Discharge: 2021-09-04 | Payer: PRIVATE HEALTH INSURANCE | Attending: Urology | Primary: Internal Medicine

## 2021-09-04 ENCOUNTER — Ambulatory Visit: Attending: Urology | Primary: Internal Medicine

## 2021-09-04 DIAGNOSIS — C61 Malignant neoplasm of prostate: Secondary | ICD-10-CM

## 2021-09-04 NOTE — Progress Notes (Signed)
Faxed imaging to [facility]; [facility] [phone]; CT AND BS; NOW

## 2021-09-04 NOTE — Progress Notes (Signed)
Progress Notes by Kathreen Cornfield, MD at 09/04/21 0845                Author: Kathreen Cornfield, MD  Service: --  Author Type: Physician       Filed: 09/04/21 0956  Encounter Date: 09/04/2021  Status: Signed          Editor: Kathreen Cornfield, MD (Physician)                    Cameron Coleman   DOB Jun 23, 1961   60 y.o.   09/04/2021      UROLOGY NEW PATIENT        Encounter Diagnoses              ICD-10-CM  ICD-9-CM          1.  Prostate cancer ([place])   C61  185          2.  Erectile dysfunction, unspecified erectile dysfunction type   N52.9  607.84        Assessment:    1. 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               Family hx of CaP-father                Most recent PSA 9.4 on 08/26/21.               MRI pelvis 04/22/2021: PIRADS 5 lesion -- Prostate  volume 28 mL.        2. ED- seen in 2017 by Dr. Marthann Schiller.  Takes Cialis         Plan:     Reviewed previous notes from Dr. Rozelle Logan on 06/02/21.    Reviewed most recent PSA of 9.4 on 08/26/21.   Reviewed pathology from [place] bx on 05/18/21. Discussed the Gleason score grading scale in depth.    Discussed the [facility] for unfavorable intermediate risk prostate cancer would be prostatectomy vs. radiation. Risks and benefits of each option discussed.  Reviewed MSK nomogram.  Patient is not interested in surgery. Will refer him to radiation oncology  at [facility]. Recommended 6 months of hormone therapy if patient elects radiation. Discussed the mechanism behind combination ADT + radiation therapy.    BV for ADT.  Eligard 45x1   Refer to radiation oncology at [facility].   Refer to Dr. Callie Fielding at [facility].   Continue Cialis, discussed further erectile dysfunction with localized treatment of prostate cancer   Will arrange staging  studies prior to follow-up for intermediate risk unfavorable cancer   RTC in 4 weeks to determine definitive treatment plan with CT A/P and bone scan prior.       Using NCCN risk stratification criteria, his disease is considered unfavorable intermediate risk.  We discussed his diagnosis in detail, reviewing the significance of gleason score, PSA, DRE, and percentage of cores positive.  We discussed the various  management options for prostate cancer, including active surveillance, open and robotic radical prostatectomy, EBRT, brachytherapy, proton therapy, and cryotherapy.  We discussed the generally indolent course of many prostate cancers, and the generally  favorable oncologic control offered by all treatment strategies.  However, I emphasized that  all treatments offer only potential for cure and that in many cases multimodal therapy may ultimately be utilized for long term cancer control.  We also discussed  the impact of treatment on sexual and urinary function.  I emphasized that each treatment has unique quality of life impact and recovery profiles, and we reviewed the possible effects of surgery, radiation, and cryotherapy on quality of life outcomes.   All questions were answered.      Chief complaint:     Chief Complaint       Patient presents with        ?  Prostate Cancer        History of Present Illness:  Cameron Coleman is a 60 y.o. male who is seen in consultation as referred by Dr. Rozelle Logan for evaluation and treatment  of patient's unfavorable intermediate prostate cancer. He presents today with his wife. They live in [place].        Patient has no complaints of ED. He takes Cialis.       FMHx: father had prostate cancer.       Past Medical History     Past Medical History:        Diagnosis  Date         ?  Arthritis            arthritis and left knee status post drainage x2         ?  Decreased libido       ?  Erectile dysfunction       ?  HTN (hypertension)           ?  Prostate cancer ([place])   09/04/2019           Past Surgical History     Past Surgical History:         Procedure  Laterality  Date          ?  HX UROLOGICAL    08/30/2019          PNBx, Gleason 6, Dr Edison Pace, [facility]          ?  HX UROLOGICAL    05/18/2021          TRANSRECTAL ULTRASOUND AND BIOPSY OF PROSTATE URONAV; [name]           Family History     Family History         Problem  Relation  Age of Onset          ?  Cancer  Mother       ?  Diabetes  Father            ?  Prostate Cancer  Father             Social History     Social History          Socioeconomic History         ?  Marital status:  MARRIED              Spouse name:  Not on file         ?  Number of children:  Not on file     ?  Years of education:  Not on file     ?  Highest education level:  Not on file       Occupational History        ?  Not on file       Tobacco  Use         ?  Smoking status:  Some Days              Types:  Cigars         ?  Smokeless tobacco:  Never        ?  Tobacco comments:             occ cigar smoker       Substance and Sexual Activity         ?  Alcohol use:  Yes             Comment: Socially.          ?  Drug use:  No     ?  Sexual activity:  Yes              Partners:  Female         Birth control/protection:  None        Other Topics  Concern         ?  Military Service  No     ?  Blood Transfusions  No     ?  Caffeine Concern  No     ?  Occupational Exposure  No     ?  Hobby Hazards  Yes             Comment: Theme park manager.          ?  Sleep Concern  No     ?  Stress Concern  No     ?  Weight Concern  No     ?  Special Diet  No     ?  Back Care  No     ?  Exercise  No     ?  Bike Helmet  Yes     ?  Seat Belt  Yes     ?  Self-Exams  Yes       Social History Narrative        ?  Not on file          Social Determinants of Health          Financial Resource Strain: Not on file     Food Insecurity: Not on file     Transportation Needs: Not on file     Physical Activity: Not on file     Stress: Not on file     Social Connections: Not  on file     Intimate Partner Violence: Not on file       Housing Stability: Not on file            No Known Allergies     Current Outpatient Medications        Medication  Sig         ?  tadalafiL (CIALIS) 5 mg tablet  Take 1 Tablet by mouth daily as needed for Erectile Dysfunction.     ?  furosemide (LASIX) 40 mg tablet  Take 40 mg by mouth daily as needed.         ?  irbesartan (AVAPRO) 75 mg tablet  Take 1 Tab by mouth nightly.          No current facility-administered medications for this visit.           Review of Systems   Constitutional: Fever: No   Skin: Rash: No   HEENT: Hearing difficulty: No   Eyes: Blurred  vision: No   Cardiovascular: Chest pain: No   Respiratory: Shortness of breath: No   Gastrointestinal: Nausea/vomiting: No   Musculoskeletal: Back pain: No   Neurological: Weakness: No   Psychological: Memory loss: No   Comments/additional findings:          Physical Exam:    Visit Vitals      Ht  6\' 2"  (1.88 m)     Wt  294 lb (133.4 kg)        BMI  37.75 kg/m??        Constitutional: WDWN, Pleasant and appropriate affect, No acute distress.     CV:  No peripheral swelling noted   Respiratory: No respiratory distress or difficulties   GI:  No abdominal masses or tenderness. No CVA tenderness.   Skin: No jaundice.     Neuro/Psych:  Alert and oriented x 3, affect appropriate.    Lymphatic:   No enlarged supraclavicular lymph nodes.           Labs reviewed today:     Results for orders placed or performed in visit on 06/02/21     AMB POC URINALYSIS DIP STICK AUTO W/O MICRO         Result  Value  Ref Range            Color (UA POC)  Amber         Clarity (UA POC)  Clear         Glucose (UA POC)  Negative  Negative       Bilirubin (UA POC)  Negative  Negative       Ketones (UA POC)  Negative  Negative       Specific gravity (UA POC)  1.030  1.001 - 1.035       Blood (UA POC)  1+  Negative       pH (UA POC)  6.0  4.6 - 8.0       Protein (UA POC)  Negative  Negative       Urobilinogen (UA POC)  0.2 mg/dL  0.2  - 1       Nitrites (UA POC)  Negative  Negative            Leukocyte esterase (UA POC)  Negative  Negative           Radiology: images and reports reviewed today      MRI Pelv 04/22/21   FINDINGS:   PROSTATE GLAND:   Measurements: 4.2 x 3.9 x 3.4 cm.   Volume: 28 mL.   PSA density: 0.19 using provided PSA of 5.3 ng/mL (02/21/2020)   Hemorrhage: None.   Peripheral  zone: Suspicious peripheral zone lesion.   Transition Zone: Lesion 1 in the ventral transition zone at the base crossing midline, as detailed below.   LESION 1:   Location: Ventral transition zone at the base, crosses midline (axial image  18)   Size: 2.1 x 0.9 x 1.5 cm, 1.9 mL   T2 features: Dark   ADC/DWI features: Moderately ADC dark and DWI bright   DCE: Present   Prostate margin: Bulging of the capsule highly concerning for microscopic invasion/extraprostatic extension.    PI-RADS Assessment Category: 5   NEUROVASCULAR BUNDLES: Normal.   SEMINAL VESICLES: Normal.   LYMPH NODES: No lymphadenopathy.   BONES: No osseous metastases identified.   OTHER: Unremarkable.   _______________   IMPRESSION    1. Lesion 1 in the ventral transition zone at the base measures  1.9 cm with features concerning for extraprostatic extension - PI-RADS 5.   2. No evidence of lymphadenopathy or osseous metastatic disease in the visualized pelvis.   Overall PI-RADS  assessment category: 5          A copy of today's office visit with all pertinent imaging results and labs were sent to the referring physician.     CC:       Kathreen Cornfield, MD   [facility]   [place], [place]   Phone: [phone]      Medical documentation is provided with the assistance of Berneice Heinrich, medical scribe for Kathreen Cornfield, MD on 09/04/2021

## 2021-09-04 NOTE — Progress Notes (Signed)
Sch 09/28/2021 3:00 PM CT ABDOMEN,PELVIS 30 min AIC [facility] CAT SCAN    LMOVM needs BS too and to call [facility] [phone], BS order on file now

## 2021-09-04 NOTE — Progress Notes (Signed)
Progress Notes by Sharyl Nimrod R. at 09/04/21 8099                Author: Mylo Red  Service: --  Author Type: --       Filed: 09/21/21 1128  Encounter Date: 09/04/2021  Status: Signed          Editor: Sharyl Nimrod R.                10/10: Lm to advise pt of pre-pmt amount $221.86 for eligard 45mg 

## 2021-09-04 NOTE — Progress Notes (Signed)
[name] has an order for CT ABD PELV W CONT [IMG1177] (Order [id number])  NM BONE SCAN WH BODY [[id number]] (Order [id number])      ALL MALE PATIENTS NEEDING AN MRI OF PELVIS OR PROSTATE, MUST BE SCHEDULED AT ONE OF THE FOLLOWING:    **MRI/CT (Preferred [facility])  **[facility] (Preferred [facility])  **[facility] (Preferred [place])  [facility]  [facility]      To be done at [facility]    Needed by:  Now    Patient has a follow-up appointment:  Yes  October 09, 2021    If MRI, does patient have a pacemaker:   NA    Order has been placed in connect care:  Yes    Is this a STAT order:  No      Cameron Coleman

## 2021-09-04 NOTE — Progress Notes (Signed)
Progress Notes by Sharyl Nimrod R. at 09/04/21 8502                Author: Mylo Red  Service: --  Author Type: --       Filed: 09/21/21 0745  Encounter Date: 09/04/2021  Status: Signed          Editor: Sharyl Nimrod R.               09/18/21: per [employer] rep Sherlean Foot ref #[id number] pts eligard & admin will covered with a 20% co-ins after deductible. Pts $500 deductible has been met. OOP max is $4500 with $2816.77  remaining. Josem Kaufmann is required '@800' [phone].called um who referred me to aim for auth. Spoke w/Fina who initiated eligard 51m auth-nurse grace approved auth ##[id number] 09/18/21-09/18/22.

## 2021-09-04 NOTE — Progress Notes (Signed)
09/04/2021 Faxed referral request form with required documents to [facility]     09/04/2021 Faxed referral request form with required documents to [facility] for Dr Mar Daring

## 2021-09-04 NOTE — Progress Notes (Signed)
Progress  Notes by Lenard Galloway J. at 09/04/21 1610                Author: Consuello Masse  Service: --  Author Type: Medical Assistant       Filed: 09/21/21 0745  Encounter Date: 09/04/2021  Status: Signed          Editor: Consuello Masse (Medical Assistant)               Per orders of Dr. Claiborne Billings.  I have referred Cameron Coleman to the Benefits Dept to begin benefit verification for eligard        09/04/2021    Dosage:  45   Frequency: once    Appt Date: 10/09/21   Dx Code:  c61   Secondary Dx Code:  (required for Cameron Coleman, [place], [place], [place])   Patient's ECOG Performance Status:  0   Cancer Staging:  T1cNxMx    Requested medication has been checked for any interactions with current medications: NO

## 2021-09-04 NOTE — Progress Notes (Signed)
1 OF 2 ORDERS ENTERED, RESENT BS

## 2021-09-08 NOTE — Telephone Encounter (Signed)
Left vm for pt to call the office back. From our standpoint pt can travel. If pt is requesting a note from work to restrict traveling due to treatment, he should request that from the radiation provider performing his treatment. We are unable to provide that documentation.

## 2021-09-08 NOTE — Telephone Encounter (Signed)
Patient requesting a doctors note stating that he can not travel due to medical reasons. Patient needs this by Monday.     Please call when ready for pick up

## 2021-09-15 NOTE — Telephone Encounter (Signed)
Patient needs doctors note  to give to his employer stating that he is unable to travel due to radiation. Please call when ready

## 2021-09-15 NOTE — Progress Notes (Signed)
09/15/2021 I called [facility] patient is scheduled 09/21/2021   @ 02:00 pm

## 2021-09-15 NOTE — Progress Notes (Signed)
09/15/2021 I called the [facility] to follow up on patient scheduling patient was not found for current referral I had to refax patient documents

## 2021-09-16 NOTE — Progress Notes (Signed)
09/16/2021 I received a call from [facility] she said she wanted to follow up on patient ho had been faxed for a second time I let her know I called to follow up and the person I spoke with could not locate patient and asked me to refax   Butch Penny said they reached out to the patient on 09/26 and let patient know that they did not par with his insurance and patient said he would seek treatment elsewhere

## 2021-09-16 NOTE — Progress Notes (Signed)
09/16/2021 Faxed referral request form with required documents to [facility] for Dr Floria Raveling

## 2021-09-17 NOTE — Progress Notes (Signed)
Progress Notes  by Richardo Priest at 09/17/21 [place]                Author: Richardo Priest  Service: --  Author Type: --       Filed: 09/17/21 1614  Encounter Date: 09/17/2021  Status: Signed          Editor: Richardo Priest               09/17/2021 Received email from the office of Dr Gilford Raid patient is scheduled 10/05/2021 @ 08:30 am

## 2021-09-18 NOTE — Progress Notes (Signed)
Progress  Notes by Roselyn Meier at 09/18/21 1409                Author: Glendora Score M  Service: --  Author Type: Medical Assistant       Filed: 09/18/21 1412  Encounter Date: 09/18/2021  Status: Signed          Editor: Roselyn Meier (Medical Assistant)               Pt scheduled for bone scan on 09/23/21 at 7:30am per [facility]

## 2021-09-28 ENCOUNTER — Encounter

## 2021-09-28 NOTE — Progress Notes (Signed)
Reviewed, will discuss at follow-up

## 2021-10-05 ENCOUNTER — Inpatient Hospital Stay: Payer: BLUE CROSS/BLUE SHIELD | Primary: Internal Medicine

## 2021-10-09 ENCOUNTER — Encounter: Attending: Physician Assistant | Primary: Internal Medicine

## 2021-10-09 ENCOUNTER — Ambulatory Visit: Attending: Physician Assistant | Primary: Internal Medicine

## 2021-10-09 DIAGNOSIS — C61 Malignant neoplasm of prostate: Secondary | ICD-10-CM

## 2021-10-09 MED ORDER — TADALAFIL 20 MG TABLET
20 mg | ORAL_TABLET | Freq: Every day | ORAL | 3 refills | Status: AC | PRN
Start: 2021-10-09 — End: ?

## 2021-10-09 MED ORDER — ELIGARD 45 MG (6 MONTH) SUBCUTANEOUS SYRINGE
45 mg | Freq: Once | SUBCUTANEOUS | 0 refills | Status: AC
Start: 2021-10-09 — End: 2021-10-09

## 2021-10-09 NOTE — Progress Notes (Signed)
Progress Notes by Phillis Knack, PA-C at 10/09/21 1300                Author: Phillis Knack, PA-C  Service: --  Author Type: Physician Assistant       Filed: 10/09/21 1408  Encounter Date: 10/09/2021  Status: Signed          Editor: Phillis Knack, PA-C (Physician Assistant)                    Cameron Coleman   DOB [birth year]   59 y.o.   10/09/2021        Encounter Diagnoses              ICD-10-CM  ICD-9-CM          1.  Prostate cancer ([place])   C61  185          2.  Vitamin D deficiency   E55.9  268.9           Assessment:    1. 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               Family hx of CaP-father                Most recent PSA 9.4 on 08/26/21.               MRI pelvis 04/22/2021: PIRADS 5 lesion -- Prostate  volume 28 mL.     CT A/P 09/24/21: No evidence of metastatic disease    Bone Scan 09/24/21: There is a single very small focus of increased tracer accumulation at the right scapular body. This is considered a low risk finding, particularly given lack of any metastatic  appearing lesions elsewhere.    ADT initiated 10/09/21 with Eligard 45 mg to complete 6 months of ADT.        2. ED- seen in 2017 by Dr. Marthann Schiller.  Takes Cialis         Plan:     ??  Reviewed CT A/P with no evidence of metastatic disease   ??  Reviewed Bone Scan with very small focus of increased tracer at the right scapular body. Low risk finding given lack of any metastatic appearing lesions elsewhere.   ??  Patient has met with Dr. Sylvan Cheese in the interim and has elected to undergo RT. He would like to receive RT through [facility] but has no particular follow-up arranged - will refer patient again today.    ??  ADT initiated with Eligard 45 mg. No further injections  following this.    ??  ADT start labs drawn today   ??  ADT handout given    ??  Cialis 20 mg sent to pharmacy for patient to trial.   ??  Follow-up in 5 months with PSA, T, Vit D prior. Sooner as needed.          I am following the established plan for Dr. Claiborne Billings and Dr. Claiborne Billings is the supervising physician for this day.      Discussion:   We discussed the risks and benefits of both short and long term androgen deprivation therapy (ADT) which include fatigue, hot flashes,  muscle wasting, weakness, increased central fat mass, mood changes,  erectile dysfunction and decreased libido, osteoporosis and fracture risk, and increase risk of cardiovascular disease.  We discussed methods to mitigate some of these risks including structured diet and exercise programs, and vitamin D and calcium supplementation.   We discussed the fact that ADT does not cure prostate cancer but may be used to either enhance the effect of radiation in the treatment of localized prostate cancer, or delay/slow progression in the treatment of advanced prostate cancer. He expressed  understanding and desires to proceed with treatment.         Chief complaint:     Chief Complaint       Patient presents with        ?  Prostate Cancer           History of Present Illness:  Cameron Coleman is a 60 y.o. male who is seen in follow-up of prostate cancer.  Here to finalize treatment plan.      Patient has met with Dr. Sylvan Cheese in the interim and has elected to undergo RT.  He is currently taking Cialis 5 mg but is interested in other options for ED.  Otherwise no new complaints.      FMHx: father had prostate cancer.       Past Medical History     Past Medical History:        Diagnosis  Date         ?  Arthritis            arthritis and left knee status post drainage x2         ?  Decreased libido       ?  Erectile dysfunction       ?  HTN (hypertension)           ?  Prostate cancer ([place])  09/04/2019           Past Surgical History     Past Surgical History:          Procedure  Laterality  Date          ?  HX UROLOGICAL    08/30/2019          PNBx, Gleason 6, Dr Edison Pace, [facility]          ?  HX UROLOGICAL    05/18/2021          TRANSRECTAL ULTRASOUND AND BIOPSY OF PROSTATE URONAV; [name]           Family History     Family History         Problem  Relation  Age of Onset          ?  Cancer  Mother       ?  Diabetes  Father            ?  Prostate Cancer  Father             Social History     Social History          Socioeconomic History         ?  Marital status:  MARRIED              Spouse name:  Not on file         ?  Number of children:  Not on file     ?  Years of education:  Not on file     ?  Highest education level:  Not on file       Occupational History        ?  Not on file       Tobacco Use         ?  Smoking status:  Some Days              Types:  Cigars         ?  Smokeless tobacco:  Never        ?  Tobacco comments:             occ cigar smoker       Substance and Sexual Activity         ?  Alcohol use:  Yes             Comment: Socially.          ?  Drug use:  No     ?  Sexual activity:  Yes              Partners:  Female         Birth control/protection:  None        Other Topics  Concern         ?  Military Service  No     ?  Blood Transfusions  No     ?  Caffeine Concern  No     ?  Occupational Exposure  No     ?  Hobby Hazards  Yes             Comment: Theme park manager.          ?  Sleep Concern  No     ?  Stress Concern  No     ?  Weight Concern  No     ?  Special Diet  No     ?  Back Care  No     ?  Exercise  No     ?  Bike Helmet  Yes     ?  Seat Belt  Yes     ?  Self-Exams  Yes       Social History Narrative        ?  Not on file          Social Determinants of Health          Financial Resource Strain: Not on file     Food Insecurity: Not on file     Transportation Needs: Not on file     Physical Activity: Not on file     Stress: Not on file     Social Connections: Not on file     Intimate Partner Violence: Not on file       Housing  Stability: Not on file            No Known Allergies     Current Outpatient Medications        Medication  Sig         ?  omega-3 acid ethyl esters (LOVAZA) 1 gram capsule  Take 2,000 mg by mouth two (2) times a day.     ?  tadalafiL (CIALIS) 20 mg tablet  Take 1 Tablet by mouth daily as needed for Erectile Dysfunction.     ?  leuprolide acetate, 6 month, (Eligard, 6 month,) 45 mg injection  1 Syringe by SubCUTAneous route once for 1 dose.     ?  furosemide (LASIX) 40 mg tablet  Take 40 mg by mouth daily as needed.         ?  irbesartan (AVAPRO) 75 mg tablet  Take 1 Tab by mouth nightly.          No current facility-administered medications for this visit.           Review of Systems   Constitutional: Fever: No   Skin: Rash: No   HEENT: Hearing difficulty: No   Eyes: Blurred vision: No   Cardiovascular: Chest pain: No   Respiratory: Shortness of breath: No   Gastrointestinal: Nausea/vomiting: No   Musculoskeletal: Back pain: No   Neurological: Weakness: No   Psychological: Memory loss: No   Comments/additional findings:          Physical Exam:    Visit Vitals      Ht  '6\' 2"'  (1.88 m)        BMI  37.75 kg/m??           Constitutional: WDWN, Pleasant and appropriate affect, No acute distress.     CV:  No peripheral swelling noted   Respiratory: No respiratory distress or difficulties   GI:  No abdominal masses or tenderness. No CVA tenderness.   Skin: No jaundice.     Neuro/Psych:  Alert and oriented x 3, affect appropriate.    Lymphatic:   No enlarged supraclavicular lymph nodes.           Labs reviewed today:     Results for orders placed or performed in visit on 06/02/21     AMB POC URINALYSIS DIP STICK AUTO W/O MICRO         Result  Value  Ref Range            Color (UA POC)  Amber         Clarity (UA POC)  Clear         Glucose (UA POC)  Negative  Negative       Bilirubin (UA POC)  Negative  Negative       Ketones (UA POC)  Negative  Negative       Specific gravity (UA POC)  1.030  1.001 - 1.035       Blood (UA  POC)  1+  Negative       pH (UA POC)  6.0  4.6 - 8.0       Protein (UA POC)  Negative  Negative       Urobilinogen (UA POC)  0.2 mg/dL  0.2 - 1       Nitrites (UA POC)  Negative  Negative            Leukocyte esterase (UA POC)  Negative  Negative           Radiology: images and reports reviewed today      CT A/P 09/24/21   Slight irregularity seen adjacent to the bladder margins raising the possibility of cystitis. Clinically correlate.     Otherwise, there is no evidence of an acute abdominal or pelvic process.      No evidence of metastatic disease seen in a patient stated to have prostate carcinoma.           Bone Scan 09/24/21   There is a single very small focus of increased tracer accumulation at the right scapular body. This is considered a low risk finding, particularly given lack of any metastatic appearing lesions  elsewhere. Chest CT may be considered  to evaluate for a corresponding suspicious sclerotic lesion.          MRI Pelv 04/22/21   FINDINGS:   PROSTATE GLAND:   Measurements: 4.2 x 3.9 x 3.4 cm.   Volume: 28 mL.   PSA density: 0.19 using provided PSA of 5.3 ng/mL (02/21/2020)   Hemorrhage: None.   Peripheral  zone: Suspicious peripheral zone lesion.   Transition Zone: Lesion 1 in the ventral transition zone at the base crossing midline, as detailed below.   LESION 1:   Location: Ventral transition zone at the base, crosses midline (axial image  18)   Size: 2.1 x 0.9 x 1.5 cm, 1.9 mL   T2 features: Dark   ADC/DWI features: Moderately ADC dark and DWI bright   DCE: Present   Prostate margin: Bulging of the capsule highly concerning for microscopic invasion/extraprostatic extension.    PI-RADS Assessment Category: 5   NEUROVASCULAR BUNDLES: Normal.   SEMINAL VESICLES: Normal.   LYMPH NODES: No lymphadenopathy.   BONES: No osseous metastases identified.   OTHER: Unremarkable.   _______________   IMPRESSION    1. Lesion 1 in the ventral transition zone at the base measures 1.9 cm with features concerning  for extraprostatic extension - PI-RADS 5.   2. No evidence of lymphadenopathy or osseous metastatic disease in the visualized pelvis.   Overall PI-RADS  assessment category: 5          A copy of today's office visit with all pertinent imaging results and labs were sent to the referring physician.     CC:       Phillis Knack PA-C   [facility]

## 2021-10-09 NOTE — Addendum Note (Signed)
Addendum Note by Phillis Knack, PA-C at 10/09/21 1300                Author: Phillis Knack, PA-C  Service: --  Author Type: Physician Assistant       Filed: 10/12/21 [phone]  Encounter Date: 10/09/2021  Status: Signed          Editor: Phillis Knack, PA-C (Librarian, academic)          Addended by: Phillis Knack on: 10/12/2021 09:43 AM    Modules accepted: Orders

## 2021-10-09 NOTE — Progress Notes (Signed)
10/09/2021 Faxed referral request form with required documents to [facility] Location

## 2021-10-09 NOTE — Progress Notes (Signed)
Cameron Coleman is a 60 y.o. male who is here today per the order of Phillis Knack, PA-C to receive Eligard and to have labs drawn.   Patient's identity has been verified.       Patient has not been taking supplements of at least 500 mg of calcium and 400 international units of Vitamin D daily.  This is his first ADT injection.  He has had the necessary labs drawn.  PSA, TST, and Vitamin D obtained via venipuncture without any difficulty.  Patient will be notified with lab results.    Eligard 45mg  is administered to abdomen ( left) SQ without difficulty.     Patient tolerated the injection well.    This is his only injection.  Patient has a follow-up appointment with provider on       Encounter Diagnoses   Name Primary?    Prostate cancer ([place]) Yes    Vitamin D deficiency        Orders Placed This Encounter    PROSTATE SPECIFIC AG (PSA)    VITAMIN D, 25 HYDROXY    TESTOSTERONE, TOTAL, ADULT MALE    CHEMOTHER HORMON ANTINEOPL SUB-Q/IM    LEUPROLIDE ACETATE SUSPNSION     Order Specific Question:   Dose     Answer:   45mg      Order Specific Question:   Site     Answer:   LEFT LOWER QUAD ABDOMEN     Order Specific Question:   Expiration Date     Answer:   03/13/2023     Order Specific Question:   Lot#     Answer:   [id number]     Order Specific Question:   Manufacturer     Answer:   tolmar     Order Specific Question:   Charge Quantity?     Answer:   6     Order Specific Question:   Perfomed by/Witnessed by:     Answer:   eb     Order Specific Question:   NDC#     Answer:   32671-245-80 [[id number]]    COLLECTION VENOUS BLOOD,VENIPUNCTURE    omega-3 acid ethyl esters (LOVAZA) 1 gram capsule     Sig: Take 2,000 mg by mouth two (2) times a day.    tadalafiL (CIALIS) 20 mg tablet     Sig: Take 1 Tablet by mouth daily as needed for Erectile Dysfunction.     Dispense:  30 Tablet     Refill:  3    leuprolide acetate, 6 month, (Eligard, 6 month,) 45 mg injection     Sig: 1 Syringe by SubCUTAneous route once for 1 dose.     Dispense:  1  Each     Refill:  0       Patient did not supply own medication.    Phillip Heal

## 2021-10-10 LAB — TESTOSTERONE, TOTAL, ADULT MALE: Testosterone: 283 ng/dL (ref 264–916)

## 2021-10-10 LAB — PSA, DIAGNOSTIC (PROSTATE SPECIFIC AG): Prostate Specific Ag: 8.1 ng/mL — ABNORMAL HIGH (ref 0.0–4.0)

## 2021-10-10 LAB — VITAMIN D, 25 HYDROXY: VITAMIN D, 25-HYDROXY: 17.8 ng/mL — ABNORMAL LOW (ref 30.0–100.0)

## 2021-10-10 LAB — TESTOSTERONE: Testosterone: 283 ng/dL (ref 264–916)

## 2021-10-10 LAB — PSA PROSTATIC SPECIFIC ANTIGEN: PSA: 8.1 ng/mL — ABNORMAL HIGH (ref 0.0–4.0)

## 2021-10-10 LAB — VITAMIN D 25 HYDROXY: Vit D, 25-Hydroxy: 17.8 ng/mL — ABNORMAL LOW (ref 30.0–100.0)

## 2021-10-12 MED ORDER — ERGOCALCIFEROL (VITAMIN D2) 50,000 UNIT CAP
1250 mcg (50,000 unit) | ORAL_CAPSULE | ORAL | 0 refills | Status: AC
Start: 2021-10-12 — End: ?

## 2021-11-02 NOTE — Progress Notes (Signed)
11/02/2021 I called [place] patient was seen on 10/19/2021   @ 11:00am

## 2022-02-24 ENCOUNTER — Encounter: Primary: Internal Medicine

## 2022-02-24 ENCOUNTER — Encounter: Admit: 2022-02-24 | Discharge: 2022-02-24 | Payer: PRIVATE HEALTH INSURANCE | Primary: Internal Medicine

## 2022-02-24 DIAGNOSIS — C61 Malignant neoplasm of prostate: Secondary | ICD-10-CM

## 2022-02-24 NOTE — Progress Notes (Signed)
Cameron Coleman presents today for lab draw per [facility] order.   Dr. Oleta Mouse was present in the clinic as incident to.     PSA, TST, and Vitamin D obtained via venipuncture without any difficulty.    Patient will be notified with lab results.       Orders Placed This Encounter   Procedures    Vitamin D 25 Hydroxy    PSA, Diagnostic    Testosterone    PR COLLECTION VENOUS BLOOD VENIPUNCTURE       Criss Alvine, MA

## 2022-02-25 LAB — TESTOSTERONE, TOTAL, ADULT MALE: Testosterone: 9 ng/dL — ABNORMAL LOW (ref 264–916)

## 2022-02-25 LAB — PSA, DIAGNOSTIC (PROSTATE SPECIFIC AG): Prostate Specific Ag: 0.3 ng/mL (ref 0.0–4.0)

## 2022-02-25 LAB — VITAMIN D, 25 HYDROXY: VITAMIN D, 25-HYDROXY: 29.6 ng/mL — ABNORMAL LOW (ref 30.0–100.0)

## 2022-02-25 LAB — TESTOSTERONE
Testosterone: 9 ng/dL — ABNORMAL LOW (ref 264–916)
Testosterone: 9 ng/dL — ABNORMAL LOW (ref 264–916)

## 2022-02-25 LAB — VITAMIN D 25 HYDROXY
Vit D, 25-Hydroxy: 29.6 ng/mL — ABNORMAL LOW (ref 30.0–100.0)
Vit D, 25-Hydroxy: 29.6 ng/mL — ABNORMAL LOW (ref 30.0–100.0)

## 2022-02-25 LAB — PSA, DIAGNOSTIC: PSA: 0.3 ng/mL (ref 0.0–4.0)

## 2022-02-25 LAB — PSA PROSTATIC SPECIFIC ANTIGEN: PSA: 0.3 ng/mL (ref 0.0–4.0)

## 2022-02-25 MED ORDER — VITAMIN D (ERGOCALCIFEROL) 1.25 MG (50000 UT) PO CAPS
1.25 MG (50000 UT) | ORAL_CAPSULE | ORAL | 0 refills | Status: DC
Start: 2022-02-25 — End: 2022-03-01

## 2022-03-01 ENCOUNTER — Encounter: Attending: Physician Assistant | Primary: Internal Medicine

## 2022-03-01 ENCOUNTER — Ambulatory Visit
Admit: 2022-03-01 | Discharge: 2022-03-01 | Payer: PRIVATE HEALTH INSURANCE | Attending: Physician Assistant | Primary: Internal Medicine

## 2022-03-01 DIAGNOSIS — C61 Malignant neoplasm of prostate: Secondary | ICD-10-CM

## 2022-03-01 MED ORDER — TADALAFIL 20 MG PO TABS
20 MG | ORAL_TABLET | Freq: Every day | ORAL | 4 refills | Status: AC | PRN
Start: 2022-03-01 — End: 2023-03-02

## 2022-03-01 NOTE — Progress Notes (Signed)
ASSESSMENT:    Diagnosis Orders   1. Malignant neoplasm of prostate ([place])        2. Erectile dysfunction, unspecified erectile dysfunction type             1. 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               Family hx of CaP-father                Most recent PSA 9.4 on 08/26/21.               MRI pelvis 04/22/2021: PIRADS 5 lesion -- Prostate  volume 28 mL.     CT A/P 09/24/21: No evidence of metastatic disease    Bone Scan 09/24/21: There is a single very small focus of increased tracer accumulation at the right scapular body. This is considered a low risk finding, particularly given lack of any metastatic  appearing lesions elsewhere.    ADT initiated 10/09/21 with Eligard 45 mg to complete 6 months of ADT.    Initiated RT with Dr. Inez Pilgrim 11/16/21 then transferred to Dr. Jimmye Norman at [facility] and completed RT 12/21/21.       2. ED- seen in 2017 by Dr. Marthann Schiller.  Takes Cialis 20 mg with benefit.         Plan:    Reviewed most recent labs 02/24/22: PSA 0.3ng/ml, T 9, Vit D 29.6.   Continue ergocalciferol 50,000 units weekly x 8 weeks to replete. Patient to resume daily Vit D2 or D3, 2,000 units daily following this.   Continue Vit D and Ca supplementation while T remains castrate.   Continue Cialis 20 mg prn. Refills sent.  Follow-up in 5 months via telemedicine appt with PSA, T prior to be drawn at [facility].           Dr. Claiborne Billings is the supervising physician today      Chief Complaint   Patient presents with    Prostate Cancer         HISTORY OF PRESENT ILLNESS:  Cameron Coleman is a 61 y.o. male who is seen in follow-up of prostate cancer.    ADT initiated 10/09/21 with Eligard 45 mg to complete 6 months of ADT.   Initiated RT with Dr. Inez Pilgrim 11/16/21 then  transferred to Dr. Jimmye Norman at [facility] and completed RT 12/21/21.    Patient presents today doing well.  In interim he completed RT and overall tolerated somewhat well.  He did require Flomax 0.4 mg due to urinary hesitancy, weak FOS.  He states that as long as he continues Flomax 0.4 mg he has no issues with this.  He is utilizing Cialis 20 mg as needed with benefit.  No gross hematuria, dysuria, hematochezia.  Otherwise no new complaints.      FMHx: father had prostate cancer.           Component       Prostatic Specific Ag   Latest Ref Rng & Units       0.0 - 4.0 ng/mL   02/24/2022      2:06 PM 0.3   10/09/2021      1:40 PM 8.1 (H)   02/21/2020  10:07 AM 5.3 (H)   08/15/2019      11:25 AM 5.4 (H)     Past Medical History:   Diagnosis Date    Arthritis     arthritis and left knee status post drainage x2    Decreased libido     Erectile dysfunction     HTN (hypertension)     Prostate cancer ([place]) 09/04/2019       Past Surgical History:   Procedure Laterality Date    UROLOGICAL SURGERY  05/18/2021    TRANSRECTAL ULTRASOUND AND BIOPSY OF PROSTATE URONAV; [name]    UROLOGICAL SURGERY  08/30/2019    PNBx, Gleason 6, Dr Edison Pace, [facility]       Social History     Tobacco Use    Smoking status: Some Days    Smokeless tobacco: Never   Substance Use Topics    Alcohol use: Yes    Drug use: No       No Known Allergies    Family History   Problem Relation Age of Onset    Prostate Cancer Father     Diabetes Father     Cancer Mother        Current Outpatient Medications   Medication Sig Dispense Refill    tamsulosin (FLOMAX) 0.4 MG capsule take 1 capsule by mouth once daily      tadalafil (CIALIS) 20 MG tablet Take 1 tablet by mouth daily as needed for Erectile Dysfunction 30 tablet 4    irbesartan (AVAPRO) 75 MG tablet Take 75 mg by mouth       No current facility-administered medications for this visit.         PHYSICAL EXAMINATION:   Ht '6\' 2"'$  (1.88 m)    BMI 37.75 kg/m??   Constitutional: WDWN, Pleasant and appropriate  affect, No acute distress.    CV:  No peripheral swelling noted  Respiratory: No respiratory distress or difficulties  Abdomen: Non-distended  Skin: No jaundice.    Neuro/Psych:  Alert and oriented x 3, affect appropriate.   Msk: FROM        REVIEW OF LABS AND IMAGING:      No results found for this visit on 03/01/22.      Radiology: images and reports reviewed today      CT A/P 09/24/21   Slight irregularity seen adjacent to the bladder margins raising the possibility of cystitis. Clinically correlate.     Otherwise, there is no evidence of an acute abdominal or pelvic process.      No evidence of metastatic disease seen in a patient stated to have prostate carcinoma.           Bone Scan 09/24/21   There is a single very small focus of increased tracer accumulation at the right scapular body. This is considered a low risk finding, particularly given lack of any metastatic appearing lesions  elsewhere. Chest CT may be considered to evaluate for a corresponding suspicious sclerotic lesion.          MRI Pelv 04/22/21   FINDINGS:   PROSTATE GLAND:   Measurements: 4.2 x 3.9 x 3.4 cm.   Volume: 28 mL.   PSA density: 0.19 using provided PSA of 5.3 ng/mL (02/21/2020)   Hemorrhage: None.   Peripheral  zone: Suspicious peripheral zone lesion.   Transition Zone: Lesion 1 in the ventral transition zone at the base crossing midline, as detailed below.   LESION 1:   Location: Ventral transition zone  at the base, crosses midline (axial image  18)   Size: 2.1 x 0.9 x 1.5 cm, 1.9 mL   T2 features: Dark   ADC/DWI features: Moderately ADC dark and DWI bright   DCE: Present   Prostate margin: Bulging of the capsule highly concerning for microscopic invasion/extraprostatic extension.    PI-RADS Assessment Category: 5   NEUROVASCULAR BUNDLES: Normal.   SEMINAL VESICLES: Normal.   LYMPH NODES: No lymphadenopathy.   BONES: No osseous metastases identified.   OTHER: Unremarkable.   _______________   IMPRESSION    1. Lesion 1 in the ventral  transition zone at the base measures 1.9 cm with features concerning for extraprostatic extension - PI-RADS 5.   2. No evidence of lymphadenopathy or osseous metastatic disease in the visualized pelvis.   Overall PI-RADS  assessment category: 5          A copy of today's office visit with all pertinent imaging results and labs were sent to the referring physician.     CC:       Phillis Knack PA-C   [facility]

## 2022-07-14 NOTE — Telephone Encounter (Signed)
Pt needs to reschedule telemedicne appt. Pt requested a call back to his daughter to get that rescheduled Pls assist, thank you              [employer] [phone]

## 2022-07-14 NOTE — Telephone Encounter (Signed)
Returned call to pt's daughter (as instructed) to reschedule his f/u appointment with Lestine Box. Pt's daughter confirmed and agreed to in office appt day and time. She Thanked me and call was ended.     Joan Mayans, MA

## 2022-07-22 ENCOUNTER — Encounter: Primary: Internal Medicine

## 2022-07-28 ENCOUNTER — Encounter: Attending: Physician Assistant | Primary: Internal Medicine

## 2022-08-23 ENCOUNTER — Encounter: Primary: Internal Medicine

## 2022-08-25 ENCOUNTER — Encounter: Admit: 2022-08-25 | Primary: Internal Medicine

## 2022-08-25 DIAGNOSIS — C61 Malignant neoplasm of prostate: Secondary | ICD-10-CM

## 2022-08-25 NOTE — Progress Notes (Signed)
Cameron Coleman presents today for lab draw per Dr. Tedra Coupe order.   Dr. Noreene Larsson was present in the clinic as incident to.     PSA and TST obtained via venipuncture without any difficulty.    Patient will be notified with lab results.       Orders Placed This Encounter   Procedures    Testosterone    Prostate Specific Antigen, Total    PR COLLECTION VENOUS BLOOD VENIPUNCTURE       Sundra Aland, MA

## 2022-08-26 LAB — PROSTATE SPECIFIC ANTIGEN, TOTAL: PSA: 0.43 ng/mL (ref 0.00–3.50)

## 2022-08-26 LAB — TESTOSTERONE: Testosterone: 347 ng/dL — ABNORMAL LOW (ref 348–1197)

## 2022-08-30 ENCOUNTER — Ambulatory Visit: Admit: 2022-08-30 | Discharge: 2022-08-30 | Attending: Physician Assistant | Primary: Internal Medicine

## 2022-08-30 DIAGNOSIS — C61 Malignant neoplasm of prostate: Secondary | ICD-10-CM

## 2022-08-30 MED ORDER — TAMSULOSIN HCL 0.4 MG PO CAPS
0.4 MG | ORAL_CAPSULE | Freq: Every day | ORAL | 3 refills | Status: DC
Start: 2022-08-30 — End: 2023-12-26

## 2022-08-30 NOTE — Progress Notes (Signed)
ASSESSMENT:     ICD-10-CM    1. Malignant neoplasm of prostate ([place])  C61             1. 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               Family hx of CaP-father                Most recent PSA 9.4 on 08/26/21.               MRI pelvis 04/22/2021: PIRADS 5 lesion -- Prostate  volume 28 mL.     CT A/P 09/24/21: No evidence of metastatic disease    Bone Scan 09/24/21: There is a single very small focus of increased tracer accumulation at the right scapular body. This is considered a low risk finding, particularly given lack of any metastatic  appearing lesions elsewhere.    ADT initiated 10/09/21 with Eligard 45 mg to complete 6 months of ADT.    Initiated RT with Dr. Inez Pilgrim 11/16/21 then transferred to Dr. Jimmye Norman at [facility] and completed RT 12/21/21.   Most recent PSA 0.43ng/ml on 08/25/22, T 347       2. ED- seen in 2017 by Dr. Marthann Schiller.  Takes Cialis 20 mg with benefit.         Plan:    Reviewed most recent labs 08/25/22: PSA 0.43ng/ml, T 347  Continue Cialis 20 mg prn.   Refills for Flomax 0.4 mg sent to pharmacy.    RTC in 6 months with PSA prior. Patient prefers to transition care to HBV provider 2/2 his location. Will arrange.           Dr. Claiborne Billings is the supervising physician today      Chief Complaint   Patient presents with    Prostate Cancer         HISTORY OF PRESENT ILLNESS:  Cameron Coleman is a 61 y.o. male who is seen in follow-up of prostate cancer.    ADT initiated 10/09/21 with Eligard 45 mg to complete 6 months of ADT.   Initiated RT with Dr. Inez Pilgrim 11/16/21 then transferred to Dr. Jimmye Norman at [facility] and completed RT 12/21/21.    Patient presents today doing well.  He did run out of Flomax in the interim, noted decreased FOS and  hesitancy.  He is concerned he may need Flomax in the future if he develops worsening urinary symptoms and requests a refill.  No gross hematuria, dysuria.  Utilizing Cialis 20 mg as needed with benefit.     FMHx: father had prostate cancer.           Component       Prostatic Specific Ag   Latest Ref Rng & Units       0.0 - 4.0 ng/mL   08/25/2022 0.43   02/24/2022      2:06 PM 0.3   10/09/2021      1:40 PM 8.1 (H)   02/21/2020      10:07 AM 5.3 (H)   08/15/2019      11:25 AM 5.4 (H)     Past Medical History:   Diagnosis Date    Arthritis  arthritis and left knee status post drainage x2    Decreased libido     Erectile dysfunction     HTN (hypertension)     Prostate cancer ([place]) 09/04/2019       Past Surgical History:   Procedure Laterality Date    UROLOGICAL SURGERY  05/18/2021    TRANSRECTAL ULTRASOUND AND BIOPSY OF PROSTATE URONAV; [name]    UROLOGICAL SURGERY  08/30/2019    PNBx, Gleason 6, Dr Edison Pace, [facility]       Social History     Tobacco Use    Smoking status: Some Days     Types: Cigars    Smokeless tobacco: Never   Substance Use Topics    Alcohol use: Yes    Drug use: No       No Known Allergies    Family History   Problem Relation Age of Onset    Prostate Cancer Father     Diabetes Father     Cancer Mother        Current Outpatient Medications   Medication Sig Dispense Refill    tamsulosin (FLOMAX) 0.4 MG capsule Take 1 capsule by mouth daily 90 capsule 3    tadalafil (CIALIS) 20 MG tablet Take 1 tablet by mouth daily as needed for Erectile Dysfunction 30 tablet 4    irbesartan (AVAPRO) 75 MG tablet Take 1 tablet by mouth       No current facility-administered medications for this visit.         PHYSICAL EXAMINATION:   Ht '6\' 2"'$  (1.88 m)   Wt 290 lb (131.5 kg)   BMI 37.23 kg/m   Constitutional: WDWN, Pleasant and appropriate affect, No acute distress.    CV:  No peripheral swelling noted  Respiratory: No respiratory distress or difficulties  Abdomen: Non-distended  Skin: No jaundice.     Neuro/Psych:  Alert and oriented x 3, affect appropriate.   Msk: FROM        REVIEW OF LABS AND IMAGING:      No results found for this visit on 08/30/22.      Radiology: images and reports reviewed today      CT A/P 09/24/21   Slight irregularity seen adjacent to the bladder margins raising the possibility of cystitis. Clinically correlate.     Otherwise, there is no evidence of an acute abdominal or pelvic process.      No evidence of metastatic disease seen in a patient stated to have prostate carcinoma.           Bone Scan 09/24/21   There is a single very small focus of increased tracer accumulation at the right scapular body. This is considered a low risk finding, particularly given lack of any metastatic appearing lesions  elsewhere. Chest CT may be considered to evaluate for a corresponding suspicious sclerotic lesion.          MRI Pelv 04/22/21   FINDINGS:   PROSTATE GLAND:   Measurements: 4.2 x 3.9 x 3.4 cm.   Volume: 28 mL.   PSA density: 0.19 using provided PSA of 5.3 ng/mL (02/21/2020)   Hemorrhage: None.   Peripheral  zone: Suspicious peripheral zone lesion.   Transition Zone: Lesion 1 in the ventral transition zone at the base crossing midline, as detailed below.   LESION 1:   Location: Ventral transition zone at the base, crosses midline (axial image  18)   Size: 2.1 x 0.9 x 1.5 cm, 1.9 mL   T2  features: Dark   ADC/DWI features: Moderately ADC dark and DWI bright   DCE: Present   Prostate margin: Bulging of the capsule highly concerning for microscopic invasion/extraprostatic extension.    PI-RADS Assessment Category: 5   NEUROVASCULAR BUNDLES: Normal.   SEMINAL VESICLES: Normal.   LYMPH NODES: No lymphadenopathy.   BONES: No osseous metastases identified.   OTHER: Unremarkable.   _______________   IMPRESSION    1. Lesion 1 in the ventral transition zone at the base measures 1.9 cm with features concerning for extraprostatic extension - PI-RADS 5.   2. No evidence of lymphadenopathy or osseous  metastatic disease in the visualized pelvis.   Overall PI-RADS  assessment category: 5          A copy of today's office visit with all pertinent imaging results and labs were sent to the referring physician.     CC:       Phillis Knack PA-C   [facility]

## 2022-12-27 NOTE — Telephone Encounter (Signed)
Good morning    Cameron Coleman    This message is to inform you that NP Ouida Sills is no longer with the company. Your appointment has been change to 03/10/23 at 1045 with Dr. Shelda Altes. If this appointment does not work please give the office a call or send a message.    Thank you in advance    [name]

## 2023-02-17 ENCOUNTER — Encounter: Admit: 2023-02-17 | Discharge: 2023-02-17 | Payer: PRIVATE HEALTH INSURANCE | Primary: Internal Medicine

## 2023-02-17 DIAGNOSIS — C61 Malignant neoplasm of prostate: Secondary | ICD-10-CM

## 2023-02-17 NOTE — Progress Notes (Signed)
 Cameron Coleman presents today for lab draw per PA Whichard's order.   Dr. Sammie was in clinic as incident to.     PSA was obtained via venipuncture without any difficulty.    Patient will be notified with lab results.       Orders Placed This Encounter   Procedures    PSA, Diagnostic    PR COLLECTION VENOUS BLOOD VENIPUNCTURE       [name] S Damien Batty

## 2023-02-19 LAB — PSA, DIAGNOSTIC: PSA: 0.3 ng/mL (ref 0.0–4.0)

## 2023-03-02 MED ORDER — TADALAFIL 20 MG PO TABS
20 | ORAL_TABLET | Freq: Every day | ORAL | 0 refills | Status: DC | PRN
Start: 2023-03-02 — End: 2023-06-06

## 2023-03-02 NOTE — Telephone Encounter (Signed)
Patient is requesting a refill for Cialis.     Pt is being seen in 6 month(s),was last seen on August 30, 2022.     Per last office note patient is to continue taking the above medication and has a follow-up appointment scheduled for 03/10/2023.    Requested medication has been sent to the pharmacy on file.

## 2023-03-10 ENCOUNTER — Ambulatory Visit: Attending: Urology | Primary: Internal Medicine

## 2023-03-10 DIAGNOSIS — C61 Malignant neoplasm of prostate: Secondary | ICD-10-CM

## 2023-03-10 NOTE — Progress Notes (Unsigned)
Cameron Coleman  December 14, 1960          No chief complaint on file.      ICD-10-CM    1. Malignant neoplasm of prostate ([facility])  C61       2. Erectile dysfunction, unspecified erectile dysfunction type  N52.9           ASSESSMENT:    1. 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  Most recently S/p MRI-US Fusion bx 05/18/21 demonstrated prostate cancer upstaged to cT1c Gleason score 7 (4 + 3), 5/13 cores, prostate volume 28 cc.  Family hx of CaP-father               Most recent PSA 9.4 on 08/26/21.              MRI pelvis 04/22/2021: PIRADS 5 lesion -- Prostate  volume 28 mL.               CT A/P 09/24/21: No evidence of metastatic disease              Bone Scan 09/24/21: There is a single very small focus of increased tracer accumulation at the right scapular body. This is considered a low risk finding, particularly given lack of any metastatic  appearing lesions elsewhere.              ADT initiated 10/09/21 with Eligard 45 mg to complete 6 months of ADT.               Initiated RT with Dr. Joyice Coleman 11/16/21 then transferred to Dr. Mayford Coleman at [facility] and completed RT 12/21/21.   Most recent PSA 0.3 ng/mL on 02/17/2023.        2. ED- seen in 2017 by Dr. Nada Coleman.  Takes Cialis 20 mg with benefit.       PLAN:    Reviewed most recent PSA of 0.3 ng/mL on 02/17/2023.  ***  Continue Cialis 20 mg PRN. Will provide refill if needed.   Continue Flomax 0.4 mg daily. Will provide refill if needed.   Follow up in ***.     Prior plan  Reviewed most recent labs 08/25/22: PSA 0.43ng/ml, T 347  Continue Cialis 20 mg prn.   Refills for Flomax 0.4 mg sent to pharmacy.    RTC in 6 months with PSA prior. Patient prefers to transition care to HBV provider 2/2 his location. Will arrange.      No follow-ups on file.      HISTORY OF PRESENT ILLNESS:  Cameron Coleman is a 62 y.o. male who is seen in follow up for prostate cancer and  erectile dysfunction. ADT initiated 10/09/21 with Eligard 45 mg to complete 6 months of ADT.   Initiated RT with Dr. Joyice Coleman 11/16/21 then transferred to Dr. Mayford Coleman at [facility] and completed RT 12/21/21.     Interval history:  ***    Manages urinary symptoms with Flomax 0.4 mg daily. Notices worsening of symptoms if he misses a dose including urinary hesitancy and decrease FOS.    Manages erectile dysfunction with Cialis 20 mg PRN with benefit.    FMHx: father had prostate cancer.     LABS AND IMAGING:      PSA Trend  Lab Results   Component Value Date    PSA 0.3 02/17/2023    PSA 0.43 08/25/2022    PSA 0.3  02/24/2022    PSA 0.3 02/24/2022    PSA 8.1 (H) 10/09/2021    PSA 5.3 (H) 02/21/2020    PSA 5.4 (H) 08/15/2019       CT A/P 09/24/21   Slight irregularity seen adjacent to the bladder margins raising the possibility of cystitis. Clinically correlate.     Otherwise, there is no evidence of an acute abdominal or pelvic process.      No evidence of metastatic disease seen in a patient stated to have prostate carcinoma.           Bone Scan 09/24/21   There is a single very small focus of increased tracer accumulation at the right scapular body. This is considered a low risk finding, particularly given lack of any metastatic appearing lesions  elsewhere. Chest CT may be considered to evaluate for a corresponding suspicious sclerotic lesion.          MRI Pelv 04/22/21   FINDINGS:   PROSTATE GLAND:   Measurements: 4.2 x 3.9 x 3.4 cm.   Volume: 28 mL.   PSA density: 0.19 using provided PSA of 5.3 ng/mL (02/21/2020)   Hemorrhage: None.   Peripheral  zone: Suspicious peripheral zone lesion.   Transition Zone: Lesion 1 in the ventral transition zone at the base crossing midline, as detailed below.   LESION 1:   Location: Ventral transition zone at the base, crosses midline (axial image  18)   Size: 2.1 x 0.9 x 1.5 cm, 1.9 mL   T2 features: Dark   ADC/DWI features: Moderately ADC dark and DWI bright   DCE: Present   Prostate  margin: Bulging of the capsule highly concerning for microscopic invasion/extraprostatic extension.    PI-RADS Assessment Category: 5   NEUROVASCULAR BUNDLES: Normal.   SEMINAL VESICLES: Normal.   LYMPH NODES: No lymphadenopathy.   BONES: No osseous metastases identified.   OTHER: Unremarkable.   _______________   IMPRESSION    1. Lesion 1 in the ventral transition zone at the base measures 1.9 cm with features concerning for extraprostatic extension - PI-RADS 5.   2. No evidence of lymphadenopathy or osseous metastatic disease in the visualized pelvis.   Overall PI-RADS  assessment category: 5     No results found for this visit on 03/10/23.    Most recent imaging:  NM BONE SCAN WHOLE BODY  [facility]    [address]    [place] [address]    [phone]          Imaging Result          Name:  Cameron Coleman ([id number])    Sex: Male     DOB:08-Dec-1961     Ordering Provider: Manual Coleman    CC Provider:        Diagnosis:Malignant neoplasm of prostate [facility]) [phone] (ICD-10-CM)]    None Specified     Procedures Performed:BONE SCAN WHOLE BODY    [id number]     Exam Date/Time:09/23/2021 11:10 AM                              EXAM: BONE SCAN WHOLE BODY         CLINICAL INDICATION/HISTORY: Provided with order -   C61: Malignant neoplasm of prostate ([facility])      > Additional: None.         COMPARISON: 09/23/2021  TECHNIQUE: Whole body planar bone scan imaging performed approximately 3 hours after the injection of 31.7  mCi technetium 50m MDP.         _______________         FINDINGS:         Metastatic lesions: Very small focus of increased tracer uptake in the medial body of right scapula. No highly suspicious sites of increased uptake elsewhere.         Other osseous: Degenerative findings, most pronounced in the knees and feet.         Soft tissues: Normal tracer distribution.         _______________         IMPRESSION         There is a single very small focus of  increased tracer accumulation at the right scapular body. This is considered a low risk finding, particularly given lack of any metastatic appearing lesions elsewhere. Chest CT may be considered to evaluate for a corresponding suspicious sclerotic lesion.         Signed By: Darcey Nora, MD on 09/24/2021 3:41 PM                   Dictated by: Darcey Nora on Thu Sep 24, 2021  3:37:57 PM EDT     Signed WU:[id number], Almedia Balls, MD   09/24/2021  3:41 PM     [facility] [[id number]]   ~~This report was copied and pasted from the servicing EHR system.~~  CT ABDOMEN PELVIS W IV CONTRAST  [facility]    [facility]    [address]    [place] [address]    [phone]          Imaging Result          Name:  Aron, Wolffe ([id number])    Sex: Male     DOB:07/19/1961     Ordering Provider: Manual Coleman    CC Provider:        Diagnosis:Prostate cancer [facility]) [phone] (ICD-10-CM)]    None Specified     Procedures Performed:CT ABDOMEN/PELVIS W/ CONTRAST    [id number]     Exam Date/Time:09/23/2021  9:05 AM       EXAM: CT ABDOMEN/PELVIS W/ CONTRAST         CLINICAL INDICATION/HISTORY: Provided with order -   C61: Prostate cancer (HCC)      > Additional: None         COMPARISON: None         TECHNIQUE: Helical CT imaging of the abdomen and pelvis was performed after oral as well as intravenous contrast. Multiplanar reformats were generated. One or more dose reduction techniques were used on this CT: automated exposure control, adjustment of the mAs and/or kVp according to patient size, and iterative reconstruction techniques.  The specific techniques used on this CT exam have been documented in the patient's electronic medical record.  Digital Imaging and Communications in Medicine (DICOM) format image data are available to nonaffiliated external [facility] facilities or entities on a secure, media free, reciprocally searchable basis with patient authorization for at least a  17-month period after this study.         _______________         FINDINGS:         LOWER CHEST: Groundglass opacities noted throughout both lower lobes are likely dependent atelectasis/edema.         LIVER, BILIARY:  Liver is normal. No biliary dilation. Gallbladder is unremarkable.         PANCREAS: Normal.         SPLEEN: Normal.         ADRENALS: Normal.         KIDNEYS/URETERS/BLADDER: 3.3 cm x 3.5 cm cyst lower pole right kidney. No follow-up imaging is recommended as incidental lesions are likely benign.    No evidence of stone or hydronephrosis seen on either side.    There is slight irregularity along the bladder margins.         PELVIC ORGANS: Prostate gland unremarkable         LYMPH NODES: No enlarged lymph nodes.         GASTROINTESTINAL TRACT: No bowel dilation or wall thickening. Normal appendix.         VASCULATURE: Unremarkable.         BONES: No acute or aggressive osseous abnormalities identified. Minimal degenerative changes are seen throughout the spine.         OTHER: None.         _______________         IMPRESSION         Slight irregularity seen adjacent to the bladder margins raising the possibility of cystitis. Clinically correlate.    Otherwise, there is no evidence of an acute abdominal or pelvic process.    No evidence of metastatic disease seen in a patient stated to have prostate carcinoma.                   Signed By: Ferman Hamming, MD on 09/24/2021 8:49 AM                   Dictated by: Daymon Larsen on Thu Sep 24, 2021  8:43:06 AM EDT     Signed By:Kim, Man H, MD   09/24/2021  8:49 AM            Scans Related to Order [id number]    Hospital Outpatient Order - Scan on 09/04/2021 1034       [facility] [[id number]]   ~~This report was copied and pasted from the servicing EHR system.~~        Past Medical History:   Diagnosis Date    Arthritis     arthritis and left knee status post drainage x2    Decreased libido     Erectile dysfunction     HTN (hypertension)     Prostate  cancer ([facility]) 09/04/2019       Past Surgical History:   Procedure Laterality Date    UROLOGICAL SURGERY  05/18/2021    TRANSRECTAL ULTRASOUND AND BIOPSY OF PROSTATE URONAV; Theo Dills    UROLOGICAL SURGERY  08/30/2019    PNBx, Gleason 6, Dr Michaele Offer, [facility]       Social History     Tobacco Use    Smoking status: Some Days     Types: Cigars    Smokeless tobacco: Never   Substance Use Topics    Alcohol use: Yes    Drug use: No       No Known Allergies    Family History   Problem Relation Age of Onset    Prostate Cancer Father     Diabetes Father     Cancer Mother        Current Outpatient Medications   Medication Sig Dispense Refill    tadalafil (CIALIS) 20 MG tablet  TAKE ONE TABLET BY MOUTH DAILY AS NEEDED FOR ERECTILE DYSFUNCTION 10 tablet 0    tamsulosin (FLOMAX) 0.4 MG capsule Take 1 capsule by mouth daily 90 capsule 3    irbesartan (AVAPRO) 75 MG tablet Take 1 tablet by mouth       No current facility-administered medications for this visit.     PHYSICAL EXAMINATION:   There were no vitals taken for this visit.  Constitutional: WDWN, Pleasant and appropriate affect, No acute distress.    CV:  No peripheral swelling noted  Respiratory: No respiratory distress or difficulties  Abdomen:  No abdominal masses or tenderness. No CVA tenderness. No inguinal hernias noted.   GU Male 03/08/2023:    [id number] normal to visual inspection, no erythema or irritation, Sphincter with good tone, Rectum with no hemorrhoids, fissures or masses, Prostate smooth, symmetric and anodular. Prostate is ***  SCROTUM:  No scrotal rash or lesions noticed.  Normal bilateral testes and epididymis.   PENIS: Urethral meatus normal in location and size. No urethral discharge.  Skin: No jaundice.    Neuro/Psych:  Alert and oriented x 3, affect appropriate.   Lymphatic:   No enlarged inguinal lymph nodes.        Patient's BMI is out of the normal parameters.  Information about BMI was given and patient was advised to follow-up with their PCP  for further management.      A copy of today's office visit with all pertinent imaging results and labs were sent to the referring physician.        Karie Fetch, M.D.  [facility]   [address]   [place], [place] [address]   Phone: [phone]    Fax: [phone]    [address]  [place], [place] [address]  P: [phone]   F: [phone]    Medical documentation is provided with the assistance of Marcy Siren, note prepared for [employer]

## 2023-06-06 MED ORDER — TADALAFIL 20 MG PO TABS
20 | ORAL_TABLET | Freq: Every day | ORAL | 0 refills | Status: AC | PRN
Start: 2023-06-06 — End: ?

## 2023-06-23 ENCOUNTER — Ambulatory Visit: Admit: 2023-06-23 | Payer: PRIVATE HEALTH INSURANCE | Attending: Urology | Primary: Internal Medicine

## 2023-06-23 VITALS — Ht 74.0 in | Wt 265.0 lb

## 2023-06-23 DIAGNOSIS — C61 Malignant neoplasm of prostate: Principal | ICD-10-CM

## 2023-06-23 MED ORDER — TADALAFIL 5 MG PO TABS
5 | ORAL_TABLET | Freq: Every day | ORAL | 3 refills | Status: AC
Start: 2023-06-23 — End: ?

## 2023-06-23 NOTE — Progress Notes (Signed)
Cameron Coleman  [birth year]        Chief Complaint   Patient presents with    Prostate Cancer       ICD-10-CM    1. Malignant neoplasm of prostate (HCC)  C61       2. Erectile dysfunction, unspecified erectile dysfunction type  N52.9       3. 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               Family hx of CaP-father                MRI pelvis 04/22/2021: PIRADS 5 lesion -- Prostate  volume 28 mL.               CT A/P 09/24/21: No evidence of metastatic disease              Bone Scan 09/24/21: There is a single very small focus of increased tracer accumulation at the right scapular body. This is considered a low risk finding, particularly given lack of any metastatic  appearing lesions elsewhere.              ADT initiated 10/09/21 with Eligard 45 mg to complete 6 months of ADT.               Initiated RT with Dr. Joyice Faster 11/16/21 then transferred to Dr. Mayford Knife at [facility] and completed RT 12/21/21.              Most recent PSA 0.3 ng/ml on 02/17/23, T 347 on 08/25/22       2. ED- seen in 2017 by Dr. Nada Boozer. Takes Cialis 20 mg with benefit.            PLAN:    Reviewed most recent PSA with excellent decline and stable at 0.3.  Stop Flomax.  Start tadalafil 5 mg daily for both ED and lower urinary tract symptoms.  Reviewed for on-demand dosing he should reduce his tadalafil to 10 mg as needed.  Return to clinic in 6 months PSA prior.          HISTORY OF PRESENT ILLNESS:  Cameron Coleman is a 62 y.o. male who is seen in follow up for prostate cancer.    Interim:  Doing well and feels well.  Taking tadalafil as needed for rigidity of his erections.  Noted  improvement of his LUTS when he does this.  Strong force of stream and feels like he empties.  When he does not take this he notes weak stream urinary urgency and sensation of incomplete emptying.  He is no longer taking Flomax.   FMHx: father had prostate cancer.     Prior: ADT initiated 10/09/21 with Eligard 45 mg to complete 6 months of ADT.   Initiated RT with Dr. Joyice Faster 11/16/21 then transferred to Dr. Mayford Knife at [facility] and completed RT 12/21/21.     Patient presents today doing well.  He did run out of Flomax in the  interim, noted decreased FOS and hesitancy.  He is concerned he may need Flomax in the future if he develops worsening urinary symptoms and requests a refill.  No gross hematuria, dysuria.  Utilizing Cialis 20 mg as needed with benefit.    Lab Results   Component Value Date    PSA 0.3 02/17/2023    PSA 0.43 08/25/2022    PSA 0.3 02/24/2022    PSA 0.3 02/24/2022    PSA 8.1 (H) 10/09/2021    PSA 5.3 (H) 02/21/2020    PSA 5.4 (H) 08/15/2019    PSA 5.330 (A) 06/13/2019           [facility]    PSA   Latest Ref Rng 0.0 - 4.0 ng/mL   06/13/2019 5.330 !    08/15/2019 5.4 (H)    02/21/2020 5.3 (H)    10/09/2021 8.1 (H)    02/24/2022 0.3     0.3    08/25/2022 0.43    02/17/2023 0.3       Legend:  ! Abnormal  (H) High         No data to display                  Past Medical History:   Diagnosis Date    Arthritis     arthritis and left knee status post drainage x2    Decreased libido     Erectile dysfunction     HTN (hypertension)     Prostate cancer (HCC) 09/04/2019       Past Surgical History:   Procedure Laterality Date    UROLOGICAL SURGERY  05/18/2021    TRANSRECTAL ULTRASOUND AND BIOPSY OF PROSTATE URONAV; Cameron Coleman    UROLOGICAL SURGERY  08/30/2019    PNBx, Gleason 6, Dr Cameron Coleman, [facility]       Social History     Tobacco Use    Smoking status: Some Days     Types: Cigars    Smokeless tobacco: Never   Substance Use Topics    Alcohol use: Yes    Drug use: No       No Known Allergies    Family History    Problem Relation Age of Onset    Prostate Cancer Father     Diabetes Father     Cancer Mother        Current Outpatient Medications   Medication Sig Dispense Refill    tadalafil (CIALIS) 5 MG tablet Take 1 tablet by mouth daily 90 tablet 3    tadalafil (CIALIS) 20 MG tablet TAKE 1 TABLET BY MOUTH DAILY AS NEEDED FOR ERECTILE DYSFUNCTION 10 tablet 0    furosemide (LASIX) 40 MG tablet Take 1 tablet by mouth daily      tamsulosin (FLOMAX) 0.4 MG capsule Take 1 capsule by mouth daily 90 capsule 3    irbesartan (AVAPRO) 75 MG tablet Take 1 tablet by mouth       No current facility-administered medications for this visit.           PHYSICAL EXAMINATION:   Ht 1.88 m (6\' 2" )   Wt 120.2 kg (265 lb)   BMI 34.02 kg/m   Constitutional: WDWN, Pleasant and appropriate affect, No acute distress.    CV:  No peripheral swelling noted  Respiratory: No respiratory distress or difficulties  Abdomen:  No abdominal masses or tenderness. No CVA tenderness. No inguinal hernias noted.     Skin: No jaundice.    Neuro/Psych:  Alert and oriented x 3, affect appropriate.   Lymphatic:   No enlarged inguinal lymph nodes.        REVIEW OF LABS AND IMAGING:    No results found for this visit on 06/23/23.    Lab Results   Component Value Date/Time    PSA 0.3 02/17/2023 12:56 PM    PSA 0.43 08/25/2022 11:44 AM    PSA 0.3 02/24/2022 02:06 PM    PSA 0.3 02/24/2022 02:06 PM    PSA 5.330 06/13/2019 12:00 AM        CT A/P 09/24/21   Slight irregularity seen adjacent to the bladder margins raising the possibility of cystitis. Clinically correlate.     Otherwise, there is no evidence of an acute abdominal or pelvic process.      No evidence of metastatic disease seen in a patient stated to have prostate carcinoma.           Bone Scan 09/24/21   There is a single very small focus of increased tracer accumulation at the right scapular body. This is considered a low risk finding, particularly given lack of any metastatic appearing lesions  elsewhere.  Chest CT may be considered to evaluate for a corresponding suspicious sclerotic lesion.          MRI Pelv 04/22/21   FINDINGS:   PROSTATE GLAND:   Measurements: 4.2 x 3.9 x 3.4 cm.   Volume: 28 mL.   PSA density: 0.19 using provided PSA of 5.3 ng/mL (02/21/2020)   Hemorrhage: None.   Peripheral  zone: Suspicious peripheral zone lesion.   Transition Zone: Lesion 1 in the ventral transition zone at the base crossing midline, as detailed below.   LESION 1:   Location: Ventral transition zone at the base, crosses midline (axial image  18)   Size: 2.1 x 0.9 x 1.5 cm, 1.9 mL   T2 features: Dark   ADC/DWI features: Moderately ADC dark and DWI bright   DCE: Present   Prostate margin: Bulging of the capsule highly concerning for microscopic invasion/extraprostatic extension.    PI-RADS Assessment Category: 5   NEUROVASCULAR BUNDLES: Normal.   SEMINAL VESICLES: Normal.   LYMPH NODES: No lymphadenopathy.   BONES: No osseous metastases identified.   OTHER: Unremarkable.   _______________   IMPRESSION    1. Lesion 1 in the ventral transition zone at the base measures 1.9 cm with features concerning for extraprostatic extension - PI-RADS 5.   2. No evidence of lymphadenopathy or osseous metastatic disease in the visualized pelvis.   Overall PI-RADS  assessment category: 5         A copy of today's office visit with all pertinent imaging results and labs were sent to the referring physician.      Patient's BMI is out of the normal parameters.  Information about BMI was given and patient was advised to follow-up with their PCP for further management.         Karie Fetch, M.D.  [facility]   Assistant Professor  [facility]    [address]   [place], [place] [address]   Phone: [phone]    Fax: [phone]    [address], [address]  [place], [place] [address]  P: [phone]   F: [phone]    Medical documentation provided with the assistance of Roby Lofts, medical scribe  for Karie Fetch, MD on 06/23/2023

## 2023-09-30 NOTE — Telephone Encounter (Signed)
Per Dr. Jeralene Peters note:   Stop Flomax.  Start tadalafil 5 mg daily for both ED and lower urinary tract symptoms.  Reviewed for on-demand dosing he should reduce his tadalafil to 10 mg as needed.  Patient will need to contact [facility] staff if he needs to use this medication.    Verlin Fester, MA

## 2023-12-19 ENCOUNTER — Encounter: Admit: 2023-12-19 | Payer: PRIVATE HEALTH INSURANCE | Primary: Internal Medicine

## 2023-12-19 DIAGNOSIS — C61 Malignant neoplasm of prostate: Secondary | ICD-10-CM

## 2023-12-19 LAB — PROSTATE SPECIFIC ANTIGEN, TOTAL: PSA: 0.19 ng/mL (ref 0.00–4.00)

## 2023-12-19 NOTE — Progress Notes (Signed)
 Cameron Coleman presents today for lab draw per Dr. Hellen order.   NP Cuba Chandra was present in the clinic as incident to.     PSA obtained via venipuncture without any difficulty.    Patient will be notified with lab results.       Orders Placed This Encounter   Procedures    COLLECTION VENOUS BLOOD,VENIPUNCTURE    Prostate Specific Antigen, Total       [name], MA

## 2023-12-19 NOTE — Addendum Note (Signed)
 Addended by: Inis Sizer on: 12/19/2023 02:15 PM     Modules accepted: Level of Service

## 2023-12-20 NOTE — Other (Signed)
 Reviewed. Will discuss at appt

## 2023-12-26 ENCOUNTER — Ambulatory Visit: Admit: 2023-12-26 | Payer: PRIVATE HEALTH INSURANCE | Attending: Physician Assistant | Primary: Internal Medicine

## 2023-12-26 VITALS — Ht 73.0 in | Wt 250.0 lb

## 2023-12-26 DIAGNOSIS — C61 Malignant neoplasm of prostate: Secondary | ICD-10-CM

## 2023-12-26 LAB — AMB POC PVR, MEAS,POST-VOID RES,US,NON-IMAGING: PVR, POC: 48 mL

## 2023-12-26 NOTE — Progress Notes (Signed)
Cameron Coleman  [birth year]        No supervising physician today.       ICD-10-CM    1. Malignant neoplasm of prostate (HCC)  C61 AMB POC PVR, MEAS,POST-VOID RES,US,NON-IMAGING      2. Weak urinary stream  R39.12 AMB POC PVR, MEAS,POST-VOID RES,US,NON-IMAGING      3. Erectile dysfunction, unspecified erectile dysfunction type  N52.9         ASSESSMENT:    1. 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               Family hx of CaP-father                MRI pelvis 04/22/2021: PIRADS 5 lesion -- Prostate  volume 28 mL.               CT A/P 09/24/21: No evidence of metastatic disease              Bone Scan 09/24/21: There is a single very small focus of increased tracer accumulation at the right scapular body. This is considered a low risk finding, particularly given lack of any metastatic  appearing lesions elsewhere.              ADT initiated 10/09/21 with Eligard 45 mg to complete 6 months of ADT.               Initiated RT with Dr. Joyice Faster 11/16/21 then transferred to Dr. Mayford Knife at [facility] and completed RT 12/21/21.              Most recent PSA 12/19/23 - 0.19 ng/mL     2. ED, On Cialis 5 mg daily + boost of Cialis 10 mg PRN    3. Weak FOS, On Cialis 5 mg daily with benefit             No benefit with Flomax.        PLAN:    Reviewed most recent PSA with excellent decline and stable at 0.19.  Continue Cialis 5 mg daily for ED and LUTS. Reviewed for on-demand dosing boost 10 mg PRN.   Return to clinic in 6 months with Dr. Doreene Burke with PSA prior.    Chief Complaint   Patient presents with    Prostate Cancer    Benign Prostatic Hypertrophy     W obs/ luts     HISTORY OF PRESENT ILLNESS:  Cameron Coleman is a 63 y.o. male who is seen in follow up for  prostate cancer, LUTS, and ED.     Interim:  Doing well and feels well.  Taking tadalafil as needed for rigidity of his erections and weak FOS with benefit.  Strong force of stream and feels like he empties.  When he does not take this he notes weak stream urinary urgency and sensation of incomplete emptying.  He is no longer taking Flomax.   FMHx: father had prostate cancer.     Prior: ADT initiated 10/09/21 with Eligard 45 mg to complete 6 months of ADT.   Initiated RT with Dr. Joyice Faster 11/16/21 then transferred to Dr. Mayford Knife at [facility] and completed  RT 12/21/21.    Lab Results   Component Value Date    PSA 0.19 12/19/2023    PSA 0.3 02/17/2023    PSA 0.43 08/25/2022    PSA 0.3 02/24/2022    PSA 0.3 02/24/2022    PSA 8.1 (H) 10/09/2021    PSA 5.3 (H) 02/21/2020    PSA 5.4 (H) 08/15/2019    PSA 5.330 (A) 06/13/2019           [facility]    PSA   Latest Ref Rng 0.0 - 4.0 ng/mL   06/13/2019 5.330 !    08/15/2019 5.4 (H)    02/21/2020 5.3 (H)    10/09/2021 8.1 (H)    02/24/2022 0.3     0.3    08/25/2022 0.43    02/17/2023 0.3       Legend:  ! Abnormal  (H) High         No data to display                  Past Medical History:   Diagnosis Date    Arthritis     arthritis and left knee status post drainage x2    Decreased libido     Erectile dysfunction     HTN (hypertension)     Prostate cancer (HCC) 09/04/2019       Past Surgical History:   Procedure Laterality Date    UROLOGICAL SURGERY  05/18/2021    TRANSRECTAL ULTRASOUND AND BIOPSY OF PROSTATE URONAV; Theo Dills    UROLOGICAL SURGERY  08/30/2019    PNBx, Gleason 6, Dr Michaele Offer, [facility]       Social History     Tobacco Use    Smoking status: Some Days     Types: Cigars    Smokeless tobacco: Never   Substance Use Topics    Alcohol use: Yes    Drug use: No       No Known Allergies    Family History   Problem Relation Age of Onset    Prostate Cancer Father     Diabetes Father     Cancer Mother        Current Outpatient Medications   Medication Sig Dispense Refill     acetaminophen-codeine (TYLENOL #3) 300-30 MG per tablet       imiquimod (ALDARA) 5 % cream Apply 1 packet topically to affected area every night, leaving on for 8 hours, for up to 8 weeks  Indications: external genital wart      omega-3 acid ethyl esters (LOVAZA) 1 g capsule Take 2 capsules by mouth 2 times daily      tadalafil (CIALIS) 5 MG tablet Take 1 tablet by mouth daily 90 tablet 3    tadalafil (CIALIS) 20 MG tablet TAKE 1 TABLET BY MOUTH DAILY AS NEEDED FOR ERECTILE DYSFUNCTION 10 tablet 0    furosemide (LASIX) 40 MG tablet Take 1 tablet by mouth daily      irbesartan (AVAPRO) 75 MG tablet Take 1 tablet by mouth       No current facility-administered medications for this visit.           PHYSICAL EXAMINATION:   Ht 1.854 m (6\' 1" )   Wt 113.4 kg (250 lb)   BMI 32.98 kg/m   Constitutional: WDWN, Pleasant and appropriate affect, No acute distress.    CV:  No peripheral swelling noted  Respiratory: No respiratory distress or difficulties  Skin: No jaundice.    Neuro/Psych:  Alert and oriented x 3, affect appropriate.     REVIEW OF LABS AND IMAGING:    Results for orders placed or performed in visit on 12/26/23   AMB POC PVR, MEAS,POST-VOID RES,US,NON-IMAGING   Result Value Ref Range    PVR, POC 48 cc       Lab Results   Component Value Date/Time    PSA 0.19 12/19/2023 10:50 AM    PSA 0.3 02/17/2023 12:56 PM    PSA 0.43 08/25/2022 11:44 AM    PSA 5.330 06/13/2019 12:00 AM        CT A/P 09/24/21   Slight irregularity seen adjacent to the bladder margins raising the possibility of cystitis. Clinically correlate.     Otherwise, there is no evidence of an acute abdominal or pelvic process.      No evidence of metastatic disease seen in a patient stated to have prostate carcinoma.           Bone Scan 09/24/21   There is a single very small focus of increased tracer accumulation at the right scapular body. This is considered a low risk finding, particularly given lack of any metastatic appearing lesions  elsewhere.  Chest CT may be considered to evaluate for a corresponding suspicious sclerotic lesion.          MRI Pelv 04/22/21   FINDINGS:   PROSTATE GLAND:   Measurements: 4.2 x 3.9 x 3.4 cm.   Volume: 28 mL.   PSA density: 0.19 using provided PSA of 5.3 ng/mL (02/21/2020)   Hemorrhage: None.   Peripheral  zone: Suspicious peripheral zone lesion.   Transition Zone: Lesion 1 in the ventral transition zone at the base crossing midline, as detailed below.   LESION 1:   Location: Ventral transition zone at the base, crosses midline (axial image  18)   Size: 2.1 x 0.9 x 1.5 cm, 1.9 mL   T2 features: Dark   ADC/DWI features: Moderately ADC dark and DWI bright   DCE: Present   Prostate margin: Bulging of the capsule highly concerning for microscopic invasion/extraprostatic extension.    PI-RADS Assessment Category: 5   NEUROVASCULAR BUNDLES: Normal.   SEMINAL VESICLES: Normal.   LYMPH NODES: No lymphadenopathy.   BONES: No osseous metastases identified.   OTHER: Unremarkable.   _______________   IMPRESSION    1. Lesion 1 in the ventral transition zone at the base measures 1.9 cm with features concerning for extraprostatic extension - PI-RADS 5.   2. No evidence of lymphadenopathy or osseous metastatic disease in the visualized pelvis.   Overall PI-RADS  assessment category: 5         A copy of today's office visit with all pertinent imaging results and labs were sent to the referring physician.      Patient's BMI is out of the normal parameters.  Information about BMI was given and patient was advised to follow-up with their PCP for further management.    Reather Converse, PA-C  [facility]   [address] [place], [address]  [place], [place] [address]  P: [phone]   F: [phone]
